# Patient Record
Sex: Male | Born: 1975 | Race: White | Hispanic: No | Marital: Married | Smoking: Never smoker
Health system: Southern US, Community
[De-identification: ages and names within clinical notes are randomized; demographics above are authoritative.]

## PROBLEM LIST (undated history)

## (undated) DIAGNOSIS — K8020 Calculus of gallbladder without cholecystitis without obstruction: Secondary | ICD-10-CM

## (undated) DIAGNOSIS — I10 Essential (primary) hypertension: Secondary | ICD-10-CM

## (undated) DIAGNOSIS — E119 Type 2 diabetes mellitus without complications: Secondary | ICD-10-CM

## (undated) HISTORY — DX: Essential (primary) hypertension: I10

---

## 2004-04-13 ENCOUNTER — Emergency Department: Payer: Self-pay | Admitting: Emergency Medicine

## 2004-06-02 ENCOUNTER — Emergency Department: Payer: Self-pay | Admitting: Unknown Physician Specialty

## 2009-07-06 ENCOUNTER — Emergency Department: Payer: Self-pay | Admitting: Emergency Medicine

## 2013-04-10 ENCOUNTER — Ambulatory Visit: Payer: Self-pay | Admitting: Physician Assistant

## 2013-04-10 LAB — RAPID STREP-A WITH REFLX: Micro Text Report: NEGATIVE

## 2013-04-13 LAB — BETA STREP CULTURE(ARMC)

## 2015-02-11 ENCOUNTER — Ambulatory Visit (INDEPENDENT_AMBULATORY_CARE_PROVIDER_SITE_OTHER): Payer: BLUE CROSS/BLUE SHIELD | Admitting: Family Medicine

## 2015-02-11 ENCOUNTER — Encounter: Payer: Self-pay | Admitting: Family Medicine

## 2015-02-11 VITALS — BP 144/91 | HR 104 | Temp 97.9°F | Resp 20 | Ht 71.0 in | Wt 224.9 lb

## 2015-02-11 DIAGNOSIS — I10 Essential (primary) hypertension: Secondary | ICD-10-CM

## 2015-02-11 MED ORDER — ATENOLOL 50 MG PO TABS: 50.0000 mg | ORAL_TABLET | Freq: Every day | ORAL | Status: DC

## 2015-02-11 NOTE — Progress Notes (Signed)
Name: Hunter StainJason L Zandi   MRN: [id number]    DOB: April 19, 1975   Date:02/11/2015       Progress Note  Subjective  Chief Complaint  Chief Complaint  Patient presents with  . Establish Care    NP (Dr. Carman ChingMoffet)  . Follow-up    BP    HPI  Elevated Blood Pressure  Pt. Is here for follow up of Elevated Blood Pressure. His daughter is a Theatre stage managernursing student and she started checking his BP about 4 weeks ago and it started coming up high. A log of BP on his cell phone shows BP averaging 145-150/90-100. Pt has never been on any pharmacotherapy for elevated blood pressure.  History reviewed. No pertinent past medical history.  History reviewed. No pertinent past surgical history.  Family History  Problem Relation Age of Onset  . Hypertension Father   . Heart disease Father     Triple bypass at 40 y.o    Social History   Social History  . Marital Status: Married    Spouse Name: N/A  . Number of Children: N/A  . Years of Education: N/A   Occupational History  . Not on file.   Social History Main Topics  . Smoking status: Never Smoker   . Smokeless tobacco: Never Used  . Alcohol Use: No  . Drug Use: No  . Sexual Activity:    Partners: Female   Other Topics Concern  . Not on file   Social History Narrative  . No narrative on file    No current outpatient prescriptions on file.  No Known Allergies  Review of Systems  Constitutional: Negative for fever and chills.  Eyes: Negative for blurred vision.  Respiratory: Negative for cough and shortness of breath.   Cardiovascular: Negative for chest pain (sometimes his chest feels tight almost as if its hard to breathe) and palpitations.  Neurological: Negative for headaches.   Objective  Filed Vitals:   02/11/15 1442  BP: 144/91  Pulse: 104  Temp: 97.9 F (36.6 C)  TempSrc: Oral  Resp: 20  Height: 5\' 11"  (1.803 m)  Weight: 224 lb 14.4 oz (102.014 kg)  SpO2: 97%    Physical Exam  Constitutional: He is oriented to  person, place, and time and well-developed, well-nourished, and in no distress.  HENT:  Head: Normocephalic and atraumatic.  Cardiovascular: Regular rhythm.  Tachycardia present.   No murmur heard. Pulmonary/Chest: Effort normal and breath sounds normal. He has no wheezes.  Neurological: He is alert and oriented to person, place, and time.  Skin: Skin is warm and dry.  Nursing note and vitals reviewed.   Assessment & Plan  1. Hypertension, poor control We'll start patient on atenolol 50 mg daily for elevated blood pressure. This should also lower his heart rate, which is above the normal range. An EKG obtained in the office today showed sinus tachycardia but no ST-T changes. Patient will follow-up with review of blood pressure logs in 2 weeks. - atenolol (TENORMIN) 50 MG tablet; Take 1 tablet (50 mg total) by mouth daily.  Dispense: 30 tablet; Refill: 0 - Comprehensive Metabolic Panel (CMET) - EKG 12-Lead   Taniela Feltus Asad A. Faylene KurtzShah [facility] 02/11/2015 3:16 PM

## 2015-02-25 ENCOUNTER — Encounter: Payer: Self-pay | Admitting: Family Medicine

## 2015-02-25 ENCOUNTER — Ambulatory Visit (INDEPENDENT_AMBULATORY_CARE_PROVIDER_SITE_OTHER): Payer: BLUE CROSS/BLUE SHIELD | Admitting: Family Medicine

## 2015-02-25 VITALS — BP 136/78 | HR 81 | Temp 98.4°F | Resp 18 | Ht 71.0 in | Wt 225.2 lb

## 2015-02-25 DIAGNOSIS — I10 Essential (primary) hypertension: Secondary | ICD-10-CM

## 2015-02-25 DIAGNOSIS — E669 Obesity, unspecified: Secondary | ICD-10-CM | POA: Insufficient documentation

## 2015-02-25 MED ORDER — ATENOLOL 50 MG PO TABS: 50.0000 mg | ORAL_TABLET | Freq: Every day | ORAL | Status: DC

## 2015-02-25 NOTE — Progress Notes (Signed)
Name: Hunter StainJason L Walrond   MRN: [id number]    DOB: 1975/10/14   Date:02/25/2015       Progress Note  Subjective  Chief Complaint  Chief Complaint  Patient presents with  . Follow-up    2 wk BP  . Hypertension    Hypertension This is a new problem. The current episode started more than 1 month ago. The problem is controlled. Pertinent negatives include no blurred vision, chest pain, headaches, palpitations or shortness of breath. Risk factors for coronary artery disease include male gender and obesity. Past treatments include beta blockers. There is no history of kidney disease, CAD/MI or CVA.    History reviewed. No pertinent past medical history.  History reviewed. No pertinent past surgical history.  Family History  Problem Relation Age of Onset  . Hypertension Father   . Heart disease Father     Triple bypass at 40 y.o    Social History   Social History  . Marital Status: Married    Spouse Name: N/A  . Number of Children: N/A  . Years of Education: N/A   Occupational History  . Not on file.   Social History Main Topics  . Smoking status: Never Smoker   . Smokeless tobacco: Never Used  . Alcohol Use: No  . Drug Use: No  . Sexual Activity:    Partners: Female   Other Topics Concern  . Not on file   Social History Narrative     Current outpatient prescriptions:  .  atenolol (TENORMIN) 50 MG tablet, Take 1 tablet (50 mg total) by mouth daily., Disp: 30 tablet, Rfl: 0  No Known Allergies   Review of Systems  Eyes: Negative for blurred vision.  Respiratory: Negative for shortness of breath.   Cardiovascular: Negative for chest pain and palpitations.  Neurological: Negative for headaches.    Objective  Filed Vitals:   02/25/15 0917  BP: 136/78  Pulse: 81  Temp: 98.4 F (36.9 C)  TempSrc: Oral  Resp: 18  Height: 5\' 11"  (1.803 m)  Weight: 225 lb 3.2 oz (102.15 kg)  SpO2: 97%    Physical Exam  Constitutional: He is oriented to person, place, and  time and well-developed, well-nourished, and in no distress.  HENT:  Head: Normocephalic and atraumatic.  Eyes: Pupils are equal, round, and reactive to light.  Cardiovascular: Normal rate, regular rhythm and normal heart sounds.   No murmur heard. Pulmonary/Chest: Effort normal and breath sounds normal. He has no wheezes.  Abdominal: Soft. Bowel sounds are normal. There is no tenderness.  Musculoskeletal: Normal range of motion. He exhibits no edema.  Neurological: He is alert and oriented to person, place, and time.  Skin: Skin is warm and dry.  Psychiatric: Mood, memory, affect and judgment normal.  Nursing note and vitals reviewed.   Assessment & Plan  1. Obesity Encouraged starting a regimen of physical activity to achieve sustained weight loss. - HgB A1c  2. Hypertension, well controlled BP well controlled on beta blocker therapy with no adverse effects. Continue. Refills provided - Lipid Profile - CBC with Differential - Comprehensive Metabolic Panel (CMET) - HgB A1c   Rustyn Conery Asad A. Faylene KurtzShah [facility] 02/25/2015 9:35 AM

## 2015-02-26 LAB — COMPREHENSIVE METABOLIC PANEL
ALK PHOS: 120 IU/L — AB (ref 39–117)
ALT: 71 IU/L — ABNORMAL HIGH (ref 0–44)
AST: 72 IU/L — AB (ref 0–40)
Albumin/Globulin Ratio: 1.5 (ref 1.1–2.5)
Albumin: 4.4 g/dL (ref 3.5–5.5)
BILIRUBIN TOTAL: 0.8 mg/dL (ref 0.0–1.2)
BUN/Creatinine Ratio: 13 (ref 9–20)
BUN: 12 mg/dL (ref 6–24)
CO2: 22 mmol/L (ref 18–29)
CREATININE: 0.91 mg/dL (ref 0.76–1.27)
Calcium: 9.8 mg/dL (ref 8.7–10.2)
Chloride: 98 mmol/L (ref 97–106)
GFR calc Af Amer: 121 mL/min/{1.73_m2} (ref >59)
GFR calc non Af Amer: 105 mL/min/{1.73_m2} (ref >59)
Globulin, Total: 3 g/dL (ref 1.5–4.5)
Glucose: 213 mg/dL — ABNORMAL HIGH (ref 65–99)
Potassium: 5 mmol/L (ref 3.5–5.2)
SODIUM: 139 mmol/L (ref 136–144)
Total Protein: 7.4 g/dL (ref 6.0–8.5)

## 2015-02-26 LAB — CBC WITH DIFFERENTIAL/PLATELET
Basophils Absolute: 0.1 10*3/uL (ref 0.0–0.2)
Basos: 1 %
EOS (ABSOLUTE): 0.2 10*3/uL (ref 0.0–0.4)
Eos: 2 %
HEMOGLOBIN: 16.1 g/dL (ref 12.6–17.7)
Hematocrit: 45.8 % (ref 37.5–51.0)
Immature Grans (Abs): 0 10*3/uL (ref 0.0–0.1)
Immature Granulocytes: 0 %
LYMPHS ABS: 2.8 10*3/uL (ref 0.7–3.1)
Lymphs: 38 %
MCH: 33.1 pg — AB (ref 26.6–33.0)
MCHC: 35.2 g/dL (ref 31.5–35.7)
MCV: 94 fL (ref 79–97)
Monocytes Absolute: 0.8 10*3/uL (ref 0.1–0.9)
Monocytes: 10 %
NEUTROS ABS: 3.5 10*3/uL (ref 1.4–7.0)
Neutrophils: 49 %
Platelets: 196 10*3/uL (ref 150–379)
RBC: 4.87 x10E6/uL (ref 4.14–5.80)
RDW: 12.5 % (ref 12.3–15.4)
WBC: 7.3 10*3/uL (ref 3.4–10.8)

## 2015-02-26 LAB — LIPID PANEL
CHOL/HDL RATIO: 6.2 ratio — AB (ref 0.0–5.0)
Cholesterol, Total: 185 mg/dL (ref 100–199)
HDL: 30 mg/dL — ABNORMAL LOW (ref >39)
LDL CALC: 135 mg/dL — AB (ref 0–99)
TRIGLYCERIDES: 99 mg/dL (ref 0–149)
VLDL CHOLESTEROL CAL: 20 mg/dL (ref 5–40)

## 2015-02-26 LAB — HEMOGLOBIN A1C
ESTIMATED AVERAGE GLUCOSE: 212 mg/dL
HEMOGLOBIN A1C: 9 % — AB (ref 4.8–5.6)

## 2015-02-26 LAB — COMMENT

## 2015-03-10 ENCOUNTER — Other Ambulatory Visit: Payer: Self-pay | Admitting: Family Medicine

## 2015-03-11 ENCOUNTER — Telehealth: Payer: Self-pay

## 2015-03-11 ENCOUNTER — Ambulatory Visit (INDEPENDENT_AMBULATORY_CARE_PROVIDER_SITE_OTHER): Payer: BLUE CROSS/BLUE SHIELD | Admitting: Family Medicine

## 2015-03-11 ENCOUNTER — Encounter: Payer: Self-pay | Admitting: Family Medicine

## 2015-03-11 VITALS — BP 138/78 | HR 71 | Temp 98.3°F | Resp 16 | Ht 71.0 in | Wt 218.9 lb

## 2015-03-11 DIAGNOSIS — IMO0001 Reserved for inherently not codable concepts without codable children: Secondary | ICD-10-CM

## 2015-03-11 DIAGNOSIS — R748 Abnormal levels of other serum enzymes: Secondary | ICD-10-CM

## 2015-03-11 DIAGNOSIS — E1165 Type 2 diabetes mellitus with hyperglycemia: Secondary | ICD-10-CM | POA: Diagnosis not present

## 2015-03-11 MED ORDER — LISINOPRIL 2.5 MG PO TABS: 2.5000 mg | ORAL_TABLET | Freq: Every day | ORAL | Status: DC

## 2015-03-11 MED ORDER — GLIPIZIDE 5 MG PO TABS: 5.0000 mg | ORAL_TABLET | Freq: Every day | ORAL | Status: DC

## 2015-03-11 NOTE — Progress Notes (Signed)
Name: Hunter Rodriguez   MRN: [id number]    DOB: 1976-01-21   Date:03/11/2015       Progress Note  Subjective  Chief Complaint  Chief Complaint  Patient presents with  . Follow-up    Pharmacotherapy  . Hypertension    Diabetes He presents for his initial diabetic visit. He has type 2 diabetes mellitus. His disease course has been stable. Pertinent negatives for diabetes include no fatigue, no polydipsia and no polyuria. He is following a generally healthy (Has cut down on sugar consumption dramatically since he learned about his diagnosis.) diet. His breakfast blood glucose range is generally 140-180 mg/dl. An ACE inhibitor/angiotensin II receptor blocker is not being taken. He does not see a podiatrist.Eye exam is not current.   Elevated Liver Enzymes Pt. Is here for work up of elevated liver enzymes, AST and ALT at 72 and 71 respectively. Pt. Has no known history of liver disease, does not drink alcohol, and has no associated  concerning symptoms.   Past Medical History  Diagnosis Date  . Hypertension     History reviewed. No pertinent past surgical history.  Family History  Problem Relation Age of Onset  . Hypertension Father   . Heart disease Father     Triple bypass at 65 y.o    Social History   Social History  . Marital Status: Married    Spouse Name: N/A  . Number of Children: N/A  . Years of Education: N/A   Occupational History  . Not on file.   Social History Main Topics  . Smoking status: Never Smoker   . Smokeless tobacco: Never Used  . Alcohol Use: No  . Drug Use: No  . Sexual Activity:    Partners: Female   Other Topics Concern  . Not on file   Social History Narrative     Current outpatient prescriptions:  .  atenolol (TENORMIN) 50 MG tablet, Take 1 tablet (50 mg total) by mouth daily., Disp: 90 tablet, Rfl: 0 .  atenolol (TENORMIN) 50 MG tablet, TAKE 1 TABLET(50 MG) BY MOUTH DAILY, Disp: 30 tablet, Rfl: 0  No Known Allergies   Review of  Systems  Constitutional: Negative for malaise/fatigue and fatigue.  Gastrointestinal: Negative for nausea, vomiting, abdominal pain, diarrhea and blood in stool.  Genitourinary: Negative for hematuria.  Endo/Heme/Allergies: Negative for polydipsia.    Objective  Filed Vitals:   03/11/15 1409  BP: 138/78  Pulse: 71  Temp: 98.3 F (36.8 C)  TempSrc: Oral  Resp: 16  Height:  (1.803 m)  Weight: 218 lb 14.4 oz (99.292 kg)  SpO2: 98%    Physical Exam  Constitutional: He is oriented to person, place, and time and well-developed, well-nourished, and in no distress.  HENT:  Head: Normocephalic and atraumatic.  Eyes: Pupils are equal, round, and reactive to light.  Cardiovascular: Normal rate, regular rhythm and normal heart sounds.   Pulmonary/Chest: Effort normal and breath sounds normal. He has no wheezes.  Abdominal: Soft. Bowel sounds are normal. There is no tenderness.  Neurological: He is alert and oriented to person, place, and time.  Skin: Skin is warm and dry.  Nursing note and vitals reviewed.    Recent Results (from the past 2160 hour(s))  Lipid Profile     Status: Abnormal   Collection Time: 02/25/15 10:09 AM  Result Value Ref Range   Cholesterol, Total 185 100 - 199 mg/dL   Triglycerides 99 0 - 149 mg/dL   HDL 30 (  L) >39 mg/dL   VLDL Cholesterol Cal 20 5 - 40 mg/dL   LDL Calculated 119135 (H) 0 - 99 mg/dL   Chol/HDL Ratio 6.2 (H) 0.0 - 5.0 ratio units    Comment:                                   T. Chol/HDL Ratio                                             Men  Women                               1/2 Avg.Risk  3.4    3.3                                   Avg.Risk  5.0    4.4                                2X Avg.Risk  9.6    7.1                                3X Avg.Risk 23.4   11.0   CBC with Differential     Status: Abnormal   Collection Time: 02/25/15 10:09 AM  Result Value Ref Range   WBC 7.3 3.4 - 10.8 x10E3/uL   RBC 4.87 4.14 - 5.80 x10E6/uL    Hemoglobin 16.1 12.6 - 17.7 g/dL   Hematocrit 14.745.8 82.937.5 - 51.0 %   MCV 94 79 - 97 fL   MCH 33.1 (H) 26.6 - 33.0 pg   MCHC 35.2 31.5 - 35.7 g/dL   RDW 56.212.5 13.012.3 - 86.515.4 %   Platelets 196 150 - 379 x10E3/uL   Neutrophils 49 %   Lymphs 38 %   Monocytes 10 %   Eos 2 %   Basos 1 %   Neutrophils Absolute 3.5 1.4 - 7.0 x10E3/uL   Lymphocytes Absolute 2.8 0.7 - 3.1 x10E3/uL   Monocytes Absolute 0.8 0.1 - 0.9 x10E3/uL   EOS (ABSOLUTE) 0.2 0.0 - 0.4 x10E3/uL   Basophils Absolute 0.1 0.0 - 0.2 x10E3/uL   Immature Granulocytes 0 %   Immature Grans (Abs) 0.0 0.0 - 0.1 x10E3/uL  Comprehensive Metabolic Panel (CMET)     Status: Abnormal   Collection Time: 02/25/15 10:09 AM  Result Value Ref Range   Glucose 213 (H) 65 - 99 mg/dL    Comment: Specimen received in contact with cells. No visible hemolysis present. However GLUC may be decreased and K increased. Clinical correlation indicated.    BUN 12 6 - 24 mg/dL   Creatinine, Ser 7.840.91 0.76 - 1.27 mg/dL   GFR calc non Af Amer 105 >59 mL/min/1.73   GFR calc Af Amer 121 >59 mL/min/1.73   BUN/Creatinine Ratio 13 9 - 20   Sodium 139 136 - 144 mmol/L   Potassium 5.0 3.5 - 5.2 mmol/L   Chloride 98 97 - 106 mmol/L   CO2 22 18 - 29 mmol/L   Calcium 9.8 8.7 -  10.2 mg/dL   Total Protein 7.4 6.0 - 8.5 g/dL   Albumin 4.4 3.5 - 5.5 g/dL   Globulin, Total 3.0 1.5 - 4.5 g/dL   Albumin/Globulin Ratio 1.5 1.1 - 2.5   Bilirubin Total 0.8 0.0 - 1.2 mg/dL   Alkaline Phosphatase 120 (H) 39 - 117 IU/L   AST 72 (H) 0 - 40 IU/L   ALT 71 (H) 0 - 44 IU/L  HgB A1c     Status: Abnormal   Collection Time: 02/25/15 10:09 AM  Result Value Ref Range   Hgb A1c MFr Bld 9.0 (H) 4.8 - 5.6 %    Comment:          Pre-diabetes: 5.7 - 6.4          Diabetes: >6.4          Glycemic control for adults with diabetes: <7.0    Est. average glucose Bld gHb Est-mCnc 212 mg/dL  Comment     Status: None   Collection Time: 02/25/15 10:09 AM  Result Value Ref Range   Comment  Comment     Comment: According to ATP-III Guidelines, HDL-C >59 mg/dL is considered a negative risk factor for CHD.      Assessment & Plan  1. Uncontrolled type 2 diabetes mellitus without complication, without long-term current use of insulin (HCC) We'll start on glipizide 5 mg daily. Results of A1c reviewed in detail along with possible complications from poorly controlled blood sugar. Started on lisinopril 2.5 mg daily for renal protection - glipiZIDE (GLUCOTROL) 5 MG tablet; Take 1 tablet (5 mg total) by mouth daily before breakfast.  Dispense: 90 tablet; Refill: 0 - lisinopril (PRINIVIL,ZESTRIL) 2.5 MG tablet; Take 1 tablet (2.5 mg total) by mouth daily.  Dispense: 90 tablet; Refill: 0  2. Elevated liver enzymes Will obtain laboratory and imaging workup for elevated liver enzymes. Likely etiology is fatty liver. Follow-up after workup is complete. - CBC with Differential - Comprehensive Metabolic Panel (CMET) - Hepatic function panel - Hepatitis panel, acute - US Abdomen Limited RUQ; Future - Gamma GT   Agness Sibrian Asad [facility] 03/11/2015 2:18 PM

## 2015-03-11 NOTE — Telephone Encounter (Signed)
Rx for glucose meter, test strips, and lancets is ready for pickup

## 2015-03-11 NOTE — Telephone Encounter (Signed)
Routed to Dr Sherryll BurgerShah to write prescription for glucose meter and strips

## 2015-03-12 LAB — CBC WITH DIFFERENTIAL/PLATELET
BASOS ABS: 0.1 10*3/uL (ref 0.0–0.2)
Basos: 1 %
EOS (ABSOLUTE): 0.1 10*3/uL (ref 0.0–0.4)
Eos: 2 %
Hematocrit: 46 % (ref 37.5–51.0)
Hemoglobin: 16.3 g/dL (ref 12.6–17.7)
IMMATURE GRANS (ABS): 0 10*3/uL (ref 0.0–0.1)
Immature Granulocytes: 0 %
LYMPHS: 40 %
Lymphocytes Absolute: 2.4 10*3/uL (ref 0.7–3.1)
MCH: 33.5 pg — AB (ref 26.6–33.0)
MCHC: 35.4 g/dL (ref 31.5–35.7)
MCV: 95 fL (ref 79–97)
MONOS ABS: 0.7 10*3/uL (ref 0.1–0.9)
Monocytes: 12 %
NEUTROS ABS: 2.7 10*3/uL (ref 1.4–7.0)
Neutrophils: 45 %
Platelets: 232 10*3/uL (ref 150–379)
RBC: 4.87 x10E6/uL (ref 4.14–5.80)
RDW: 12.6 % (ref 12.3–15.4)
WBC: 5.9 10*3/uL (ref 3.4–10.8)

## 2015-03-12 LAB — COMPREHENSIVE METABOLIC PANEL
A/G RATIO: 1.5 (ref 1.1–2.5)
ALBUMIN: 4.9 g/dL (ref 3.5–5.5)
ALT: 72 IU/L — AB (ref 0–44)
AST: 62 IU/L — ABNORMAL HIGH (ref 0–40)
Alkaline Phosphatase: 103 IU/L (ref 39–117)
BILIRUBIN TOTAL: 0.9 mg/dL (ref 0.0–1.2)
BUN / CREAT RATIO: 15 (ref 9–20)
BUN: 17 mg/dL (ref 6–24)
CALCIUM: 10 mg/dL (ref 8.7–10.2)
CHLORIDE: 98 mmol/L (ref 97–106)
CO2: 25 mmol/L (ref 18–29)
Creatinine, Ser: 1.17 mg/dL (ref 0.76–1.27)
GFR, EST AFRICAN AMERICAN: 90 mL/min/{1.73_m2} (ref >59)
GFR, EST NON AFRICAN AMERICAN: 78 mL/min/{1.73_m2} (ref >59)
GLOBULIN, TOTAL: 3.3 g/dL (ref 1.5–4.5)
Glucose: 163 mg/dL — ABNORMAL HIGH (ref 65–99)
POTASSIUM: 4.3 mmol/L (ref 3.5–5.2)
SODIUM: 141 mmol/L (ref 136–144)
TOTAL PROTEIN: 8.2 g/dL (ref 6.0–8.5)

## 2015-03-12 LAB — HEPATITIS PANEL, ACUTE
HEP B S AG: NEGATIVE
Hep A IgM: NEGATIVE
Hep B C IgM: NEGATIVE
Hep C Virus Ab: <0.1 s/co ratio (ref 0.0–0.9)

## 2015-03-12 LAB — GAMMA GT: GGT: 64 IU/L (ref 0–65)

## 2015-03-12 LAB — HEPATIC FUNCTION PANEL: Bilirubin, Direct: 0.22 mg/dL (ref 0.00–0.40)

## 2015-03-18 ENCOUNTER — Telehealth: Payer: Self-pay | Admitting: Family Medicine

## 2015-03-18 NOTE — Telephone Encounter (Signed)
Pt said that he is not going to his appt for it is out of NETWORK. He needs a place that is in NETWORK with his insurance. This is for an ultrasound.

## 2015-03-19 ENCOUNTER — Ambulatory Visit: Payer: BLUE CROSS/BLUE SHIELD

## 2015-03-24 ENCOUNTER — Telehealth: Payer: Self-pay

## 2015-03-24 NOTE — Telephone Encounter (Signed)
Routed to Dr. Shah for medication change 

## 2015-03-24 NOTE — Telephone Encounter (Signed)
Returned patient's phone call and discussed that lisinopril 2,500,000 g daily is unlikely to raise his liver enzymes. Workup is in progress for liver enzymes including an ultrasound which he is scheduled for tomorrow. He is feeling fatigued and his a.m. blood sugars are in 80s. Advised to continue check his sugars and we'll check back after results of ultrasound are available.

## 2015-03-25 ENCOUNTER — Ambulatory Visit
Admission: RE | Admit: 2015-03-25 | Discharge: 2015-03-25 | Disposition: A | Discharge status: Home / Self Care | Payer: BLUE CROSS/BLUE SHIELD | Source: Ambulatory Visit | Attending: Family Medicine | Admitting: Family Medicine

## 2015-03-25 DIAGNOSIS — R748 Abnormal levels of other serum enzymes: Secondary | ICD-10-CM

## 2015-04-06 ENCOUNTER — Encounter: Payer: Self-pay | Admitting: Emergency Medicine

## 2015-04-06 ENCOUNTER — Emergency Department: Payer: BLUE CROSS/BLUE SHIELD

## 2015-04-06 ENCOUNTER — Emergency Department
Admission: EM | Admit: 2015-04-06 | Discharge: 2015-04-06 | Disposition: A | Discharge status: Home / Self Care | Payer: BLUE CROSS/BLUE SHIELD | Attending: Emergency Medicine | Admitting: Emergency Medicine

## 2015-04-06 DIAGNOSIS — R1013 Epigastric pain: Secondary | ICD-10-CM | POA: Insufficient documentation

## 2015-04-06 DIAGNOSIS — Z79899 Other long term (current) drug therapy: Secondary | ICD-10-CM | POA: Insufficient documentation

## 2015-04-06 DIAGNOSIS — E119 Type 2 diabetes mellitus without complications: Secondary | ICD-10-CM | POA: Diagnosis not present

## 2015-04-06 DIAGNOSIS — I10 Essential (primary) hypertension: Secondary | ICD-10-CM | POA: Insufficient documentation

## 2015-04-06 DIAGNOSIS — F419 Anxiety disorder, unspecified: Secondary | ICD-10-CM | POA: Diagnosis not present

## 2015-04-06 DIAGNOSIS — R079 Chest pain, unspecified: Secondary | ICD-10-CM | POA: Diagnosis not present

## 2015-04-06 HISTORY — DX: Calculus of gallbladder without cholecystitis without obstruction: K80.20

## 2015-04-06 HISTORY — DX: Type 2 diabetes mellitus without complications: E11.9

## 2015-04-06 LAB — COMPREHENSIVE METABOLIC PANEL
ALBUMIN: 4.6 g/dL (ref 3.5–5.0)
ALK PHOS: 76 U/L (ref 38–126)
ALT: 46 U/L (ref 17–63)
AST: 32 U/L (ref 15–41)
Anion gap: 7 (ref 5–15)
BILIRUBIN TOTAL: 0.4 mg/dL (ref 0.3–1.2)
BUN: 19 mg/dL (ref 6–20)
CALCIUM: 9.4 mg/dL (ref 8.9–10.3)
CO2: 27 mmol/L (ref 22–32)
Chloride: 106 mmol/L (ref 101–111)
Creatinine, Ser: 0.92 mg/dL (ref 0.61–1.24)
GFR calc Af Amer: >60 mL/min (ref >60)
GLUCOSE: 118 mg/dL — AB (ref 65–99)
Potassium: 4.1 mmol/L (ref 3.5–5.1)
Sodium: 140 mmol/L (ref 135–145)
TOTAL PROTEIN: 8.2 g/dL — AB (ref 6.5–8.1)

## 2015-04-06 LAB — CBC
HEMATOCRIT: 46.5 % (ref 40.0–52.0)
Hemoglobin: 16.4 g/dL (ref 13.0–18.0)
MCH: 33.2 pg (ref 26.0–34.0)
MCHC: 35.2 g/dL (ref 32.0–36.0)
MCV: 94.3 fL (ref 80.0–100.0)
Platelets: 186 10*3/uL (ref 150–440)
RBC: 4.93 MIL/uL (ref 4.40–5.90)
RDW: 12.3 % (ref 11.5–14.5)
WBC: 7.3 10*3/uL (ref 3.8–10.6)

## 2015-04-06 LAB — LIPASE, BLOOD: Lipase: 39 U/L (ref 11–51)

## 2015-04-06 LAB — TROPONIN I: Troponin I: <0.03 ng/mL (ref <0.031)

## 2015-04-06 MED ORDER — GI COCKTAIL ~~LOC~~
30.0000 mL | Freq: Once | ORAL | Status: AC
  Administered 2015-04-06: 30 mL via ORAL
  Filled 2015-04-06: qty 30

## 2015-04-06 MED ORDER — OMEPRAZOLE 20 MG PO CPDR: 20.0000 mg | DELAYED_RELEASE_CAPSULE | Freq: Every day | ORAL | Status: DC

## 2015-04-06 NOTE — ED Notes (Signed)
Pt says he began having chest pain to the center of his chest since yesterday; dull; worse with laying down; tried OTC indigestion medication with no relief; pt's father and grandfather had cardiac issues and pt says he's nervous;

## 2015-04-06 NOTE — Discharge Instructions (Signed)
Nonspecific Chest Pain  °Chest pain can be caused by many different conditions. There is always a chance that your pain could be related to something serious, such as a heart attack or a blood clot in your lungs. Chest pain can also be caused by conditions that are not life-threatening. If you have chest pain, it is very important to follow up with your health care provider. °CAUSES  °Chest pain can be caused by: °· Heartburn. °· Pneumonia or bronchitis. °· Anxiety or stress. °· Inflammation around your heart (pericarditis) or lung (pleuritis or pleurisy). °· A blood clot in your lung. °· A collapsed lung (pneumothorax). It can develop suddenly on its own (spontaneous pneumothorax) or from trauma to the chest. °· Shingles infection (varicella-zoster virus). °· Heart attack. °· Damage to the bones, muscles, and cartilage that make up your chest wall. This can include: °¨ Bruised bones due to injury. °¨ Strained muscles or cartilage due to frequent or repeated coughing or overwork. °¨ Fracture to one or more ribs. °¨ Sore cartilage due to inflammation (costochondritis). °RISK FACTORS  °Risk factors for chest pain may include: °· Activities that increase your risk for trauma or injury to your chest. °· Respiratory infections or conditions that cause frequent coughing. °· Medical conditions or overeating that can cause heartburn. °· Heart disease or family history of heart disease. °· Conditions or health behaviors that increase your risk of developing a blood clot. °· Having had chicken pox (varicella zoster). °SIGNS AND SYMPTOMS °Chest pain can feel like: °· Burning or tingling on the surface of your chest or deep in your chest. °· Crushing, pressure, aching, or squeezing pain. °· Dull or sharp pain that is worse when you move, cough, or take a deep breath. °· Pain that is also felt in your back, neck, shoulder, or arm, or pain that spreads to any of these areas. °Your chest pain may come and go, or it may stay  constant. °DIAGNOSIS °Lab tests or other studies may be needed to find the cause of your pain. Your health care provider may have you take a test called an ambulatory ECG (electrocardiogram). An ECG records your heartbeat patterns at the time the test is performed. You may also have other tests, such as: °· Transthoracic echocardiogram (TTE). During echocardiography, sound waves are used to create a picture of all of the heart structures and to look at how blood flows through your heart. °· Transesophageal echocardiogram (TEE). This is a more advanced imaging test that obtains images from inside your body. It allows your health care provider to see your heart in finer detail. °· Cardiac monitoring. This allows your health care provider to monitor your heart rate and rhythm in real time. °· Holter monitor. This is a portable device that records your heartbeat and can help to diagnose abnormal heartbeats. It allows your health care provider to track your heart activity for several days, if needed. °· Stress tests. These can be done through exercise or by taking medicine that makes your heart beat more quickly. °· Blood tests. °· Imaging tests. °TREATMENT  °Your treatment depends on what is causing your chest pain. Treatment may include: °· Medicines. These may include: °¨ Acid blockers for heartburn. °¨ Anti-inflammatory medicine. °¨ Pain medicine for inflammatory conditions. °¨ Antibiotic medicine, if an infection is present. °¨ Medicines to dissolve blood clots. °¨ Medicines to treat coronary artery disease. °· Supportive care for conditions that do not require medicines. This may include: °¨ Resting. °¨ Applying heat   or cold packs to injured areas. °¨ Limiting activities until pain decreases. °HOME CARE INSTRUCTIONS °· If you were prescribed an antibiotic medicine, finish it all even if you start to feel better. °· Avoid any activities that bring on chest pain. °· Do not use any tobacco products, including  cigarettes, chewing tobacco, or electronic cigarettes. If you need help quitting, ask your health care provider. °· Do not drink alcohol. °· Take medicines only as directed by your health care provider. °· Keep all follow-up visits as directed by your health care provider. This is important. This includes any further testing if your chest pain does not go away. °· If heartburn is the cause for your chest pain, you may be told to keep your head raised (elevated) while sleeping. This reduces the chance that acid will go from your stomach into your esophagus. °· Make lifestyle changes as directed by your health care provider. These may include: °¨ Getting regular exercise. Ask your health care provider to suggest some activities that are safe for you. °¨ Eating a heart-healthy diet. A registered dietitian can help you to learn healthy eating options. °¨ Maintaining a healthy weight. °¨ Managing diabetes, if necessary. °¨ Reducing stress. °SEEK MEDICAL CARE IF: °· Your chest pain does not go away after treatment. °· You have a rash with blisters on your chest. °· You have a fever. °SEEK IMMEDIATE MEDICAL CARE IF:  °· Your chest pain is worse. °· You have an increasing cough, or you cough up blood. °· You have severe abdominal pain. °· You have severe weakness. °· You faint. °· You have chills. °· You have sudden, unexplained chest discomfort. °· You have sudden, unexplained discomfort in your arms, back, neck, or jaw. °· You have shortness of breath at any time. °· You suddenly start to sweat, or your skin gets clammy. °· You feel nauseous or you vomit. °· You suddenly feel light-headed or dizzy. °· Your heart begins to beat quickly, or it feels like it is skipping beats. °These symptoms may represent a serious problem that is an emergency. Do not wait to see if the symptoms will go away. Get medical help right away. Call your local emergency services (911 in the [place] Do not drive yourself to the hospital. °  °This  information is not intended to replace advice given to you by your health care provider. Make sure you discuss any questions you have with your health care provider. °  °Document Released: 01/05/2005 Document Revised: 04/18/2014 Document Reviewed: 11/01/2013 °[employer] Interactive Patient Education ©2016 [employer] ° °

## 2015-04-06 NOTE — ED Provider Notes (Signed)
[facility] Emergency Department Provider Note  ____________________________________________  Time seen: 7 am  I have reviewed the triage vital signs and the nursing notes.   HISTORY  Chief Complaint Chest Pain    HPI Hunter StainJason L Rodriguez is a 39 y.o. male who presents with complaints of chest and epigastric discomfort. He reports started grow than 24 hours ago and has been constant. It started yesterday morning when he woke up he felt indigestion and took some Tums with significant relief but continued to have mild "aching" in his lower chest/epigastrium. He does have a history of diabetes and high blood pressure. He has a history of significant heart disease in his family less than 40 years of age so he was concerned and came to be evaluated. He denies shortness of breath. No recent travels. No fevers chills. He has not been taking his omeprazole regularly only symptomatically     Past Medical History  Diagnosis Date  . Hypertension   . Diabetes mellitus without complication (HCC)   . Gallstones     Patient Active Problem List   Diagnosis Date Noted  . Diabetes mellitus type 2, uncontrolled, without complications (HCC) 03/11/2015  . Elevated liver enzymes 03/11/2015  . Obesity 02/25/2015  . Hypertension, well controlled 02/11/2015    History reviewed. No pertinent past surgical history.  Current Outpatient Rx  Name  Route  Sig  Dispense  Refill  . atenolol (TENORMIN) 50 MG tablet   Oral   Take 1 tablet (50 mg total) by mouth daily.   90 tablet   0   . atenolol (TENORMIN) 50 MG tablet      TAKE 1 TABLET(50 MG) BY MOUTH DAILY   30 tablet   0   . glipiZIDE (GLUCOTROL) 5 MG tablet   Oral   Take 1 tablet (5 mg total) by mouth daily before breakfast.   90 tablet   0   . lisinopril (PRINIVIL,ZESTRIL) 2.5 MG tablet   Oral   Take 1 tablet (2.5 mg total) by mouth daily.   90 tablet   0     Allergies Review of patient's allergies indicates no  known allergies.  Family History  Problem Relation Age of Onset  . Hypertension Father   . Heart disease Father     Triple bypass at 40 y.o    Social History Social History  Substance Use Topics  . Smoking status: Never Smoker   . Smokeless tobacco: Never Used  . Alcohol Use: No    Review of Systems  Constitutional: Negative for fever. Eyes: Negative for visual changes. ENT: Negative for sore throat Cardiovascular: As above Respiratory: Negative for shortness of breath. Gastrointestinal: Negative for abdominal pain, vomiting and diarrhea. Genitourinary: Negative for dysuria. Musculoskeletal: Negative for back pain. Skin: Negative for rash. Neurological: Negative for headaches  Psychiatric: Mild anxiety    ____________________________________________   PHYSICAL EXAM:  VITAL SIGNS: ED Triage Vitals  Enc Vitals Group     BP 04/06/15 0625 158/103 mmHg     Pulse Rate 04/06/15 0625 72     Resp 04/06/15 0625 18     Temp 04/06/15 0625 98.3 F (36.8 C)     Temp Source 04/06/15 0625 Oral     SpO2 04/06/15 0625 100 %     Weight 04/06/15 0625 215 lb (97.523 kg)     Height 04/06/15 0625 5\' 11"  (1.803 m)     Head Cir --      Peak Flow --  Pain Score 04/06/15 0626 3     Pain Loc --      Pain Edu? --      Excl. in GC? --      Constitutional: Alert and oriented. Well appearing and in no distress. Eyes: Conjunctivae are normal.  ENT   Head: Normocephalic and atraumatic.   Mouth/Throat: Mucous membranes are moist. Cardiovascular: Normal rate, regular rhythm. Normal and symmetric distal pulses are present in all extremities. No murmurs, rubs, or gallops. Respiratory: Normal respiratory effort without tachypnea nor retractions. Breath sounds are clear and equal bilaterally.  Gastrointestinal: Soft and non-tender in all quadrants. No distention. There is no CVA tenderness. Genitourinary: deferred Musculoskeletal: Nontender with normal range of motion in all  extremities. No lower extremity tenderness nor edema. Neurologic:  Normal speech and language. No gross focal neurologic deficits are appreciated. Skin:  Skin is warm, dry and intact. No rash noted. Psychiatric: Mood and affect are normal. Patient exhibits appropriate insight and judgment.  ____________________________________________    LABS (pertinent positives/negatives)  Labs Reviewed  CBC  TROPONIN I  COMPREHENSIVE METABOLIC PANEL  LIPASE, BLOOD    ____________________________________________   EKG  ED ECG REPORT I, Jene Every, the attending physician, personally viewed and interpreted this ECG.  Date: 04/06/2015 EKG Time: 6:33 AM Rate: 69 Rhythm: normal sinus rhythm QRS Axis: normal Intervals: normal ST/T Wave abnormalities: normal Conduction Disutrbances: none Narrative Interpretation: unremarkable   ____________________________________________    RADIOLOGY I have personally reviewed any xrays that were ordered on this patient: Chest x-ray normal  ____________________________________________   PROCEDURES  Procedure(s) performed: none  Critical Care performed: none  ____________________________________________   INITIAL IMPRESSION / ASSESSMENT AND PLAN / ED COURSE  Pertinent labs & imaging results that were available during my care of the patient were reviewed by me and considered in my medical decision making (see chart for details).  Patient well-appearing and in no distress. EKG is entirely normal. History of present illness does not suggest ACS rather I'm suspicious of gastritis. We will obtain labs, chest x-ray, trial GI cocktail and reevaluate  Patient had complete resolution of discomfort with GI cocktail. Recommended to take his PPI regularly and to follow up with his PCP. Return precautions discussed  ____________________________________________   FINAL CLINICAL IMPRESSION(S) / ED DIAGNOSES  Final diagnoses:  Chest pain,  unspecified chest pain type     Jene Every, MD 04/07/15 1520

## 2015-04-06 NOTE — ED Notes (Signed)
IV insertion attempted x2 without success.

## 2015-04-07 ENCOUNTER — Telehealth: Payer: Self-pay | Admitting: Family Medicine

## 2015-04-07 NOTE — Telephone Encounter (Signed)
Pt states he was in the [facility] ER and would like a call back please.

## 2015-04-08 NOTE — Telephone Encounter (Signed)
Routed to Dr. Shah for advice  

## 2015-04-09 ENCOUNTER — Telehealth: Payer: Self-pay | Admitting: Family Medicine

## 2015-04-09 NOTE — Telephone Encounter (Signed)
Patient reports blood pressure is elevated when he first wakes up but then comes down later in the day. He takes his blood pressure medicine in the morning. Advised to check his blood pressure and return in 2 weeks for review. Reviewed ER visit and the fact that blood pressure was elevated in the ER.

## 2015-04-09 NOTE — Telephone Encounter (Signed)
Patient is requesting a refill on his test strips. Please send to [facility]. States that it was written for once day but was told to take it twice daily.

## 2015-04-10 NOTE — Telephone Encounter (Signed)
Test strips has been called in to [facility] and are ready for pickup

## 2015-04-22 ENCOUNTER — Encounter: Payer: Self-pay | Admitting: Family Medicine

## 2015-04-22 ENCOUNTER — Ambulatory Visit (INDEPENDENT_AMBULATORY_CARE_PROVIDER_SITE_OTHER): Payer: BLUE CROSS/BLUE SHIELD | Admitting: Family Medicine

## 2015-04-22 VITALS — BP 132/77 | HR 70 | Temp 97.9°F | Resp 17 | Ht 71.0 in | Wt 215.8 lb

## 2015-04-22 DIAGNOSIS — R778 Other specified abnormalities of plasma proteins: Secondary | ICD-10-CM | POA: Diagnosis not present

## 2015-04-22 DIAGNOSIS — R5383 Other fatigue: Secondary | ICD-10-CM | POA: Diagnosis not present

## 2015-04-22 DIAGNOSIS — E1165 Type 2 diabetes mellitus with hyperglycemia: Secondary | ICD-10-CM

## 2015-04-22 DIAGNOSIS — I10 Essential (primary) hypertension: Secondary | ICD-10-CM | POA: Diagnosis not present

## 2015-04-22 DIAGNOSIS — IMO0001 Reserved for inherently not codable concepts without codable children: Secondary | ICD-10-CM

## 2015-04-22 MED ORDER — ACCU-CHEK AVIVA PLUS VI STRP: 1.0000 | ORAL_STRIP | Freq: Two times a day (BID) | Status: AC

## 2015-04-22 MED ORDER — LISINOPRIL 10 MG PO TABS: 10.0000 mg | ORAL_TABLET | Freq: Every day | ORAL | Status: DC

## 2015-04-22 NOTE — Progress Notes (Signed)
Name: Hunter Rodriguez   MRN: [id number]    DOB: 11/14/1975   Date:04/22/2015       Progress Note  Subjective  Chief Complaint  Chief Complaint  Patient presents with  . Follow-up    check BP  . Diabetes  . Hypertension    Diabetes He presents for his follow-up diabetic visit. He has type 2 diabetes mellitus. His disease course has been stable. There are no hypoglycemic associated symptoms. Pertinent negatives for hypoglycemia include no headaches. Pertinent negatives for diabetes include no blurred vision and no chest pain. Symptoms are stable. Pertinent negatives for diabetic complications include no autonomic neuropathy, CVA or heart disease. Current diabetic treatment includes oral agent (monotherapy). His weight is decreasing steadily. He is following a diabetic diet. He monitors blood glucose at home 1-2 x per day. There is no change in his home blood glucose trend. His breakfast blood glucose range is generally 70-90 mg/dl. An ACE inhibitor/angiotensin II receptor blocker is being taken.  Hypertension This is a recurrent problem. The problem is unchanged. The problem is controlled. Associated symptoms include malaise/fatigue (stays tired, low energy). Pertinent negatives include no blurred vision, chest pain, headaches, palpitations or shortness of breath. Past treatments include beta blockers and ACE inhibitors. The current treatment provides significant improvement. There is no history of kidney disease, CAD/MI or CVA.    Past Medical History  Diagnosis Date  . Hypertension   . Diabetes mellitus without complication ([place])   . Gallstones     History reviewed. No pertinent past surgical history.  Family History  Problem Relation Age of Onset  . Hypertension Father   . Heart disease Father     Triple bypass at 5 y.o    Social History   Social History  . Marital Status: Married    Spouse Name: N/A  . Number of Children: N/A  . Years of Education: N/A   Occupational  History  . Not on file.   Social History Main Topics  . Smoking status: Never Smoker   . Smokeless tobacco: Never Used  . Alcohol Use: No  . Drug Use: No  . Sexual Activity:    Partners: Female   Other Topics Concern  . Not on file   Social History Narrative     Current outpatient prescriptions:  .  ACCU-CHEK AVIVA PLUS test strip, TEST BLOOD SUGAR ONCE A DAY, Disp: , Rfl: 0 .  ACCU-CHEK SOFTCLIX LANCETS lancets, TEST BLOOD SUGAR ONCE DAILY, Disp: , Rfl: 0 .  atenolol (TENORMIN) 50 MG tablet, Take 1 tablet (50 mg total) by mouth daily., Disp: 90 tablet, Rfl: 0 .  atenolol (TENORMIN) 50 MG tablet, TAKE 1 TABLET(50 MG) BY MOUTH DAILY, Disp: 30 tablet, Rfl: 0 .  Blood Glucose Monitoring Suppl (ACCU-CHEK AVIVA PLUS) w/Device KIT, FPD, Disp: , Rfl: 0 .  glipiZIDE (GLUCOTROL) 5 MG tablet, Take 1 tablet (5 mg total) by mouth daily before breakfast., Disp: 90 tablet, Rfl: 0 .  lisinopril (PRINIVIL,ZESTRIL) 2.5 MG tablet, Take 1 tablet (2.5 mg total) by mouth daily., Disp: 90 tablet, Rfl: 0 .  omeprazole (PRILOSEC) 20 MG capsule, Take 1 capsule (20 mg total) by mouth daily., Disp: 30 capsule, Rfl: 1  No Known Allergies   Review of Systems  Constitutional: Positive for malaise/fatigue (stays tired, low energy).  Eyes: Negative for blurred vision.  Respiratory: Negative for shortness of breath.   Cardiovascular: Negative for chest pain and palpitations.  Neurological: Negative for headaches.   Objective  Filed  Vitals:   04/22/15 1535  BP: 132/77  Pulse: 70  Temp: 97.9 F (36.6 C)  TempSrc: Oral  Resp: 17  Height: _0  (1.803 m)  Weight: 215 lb 12.8 oz (97.886 kg)  SpO2: 98%    Physical Exam  Constitutional: He is oriented to person, place, and time and well-developed, well-nourished, and in no distress.  HENT:  Head: Normocephalic and atraumatic.  Cardiovascular: Normal rate, regular rhythm and normal heart sounds.   Pulmonary/Chest: Effort normal and breath sounds  normal.  Abdominal: Soft. Bowel sounds are normal. There is no tenderness.  Musculoskeletal: He exhibits no edema.  Neurological: He is alert and oriented to person, place, and time.  Nursing note and vitals reviewed.   Recent Results (from the past 2160 hour(s))  Lipid Profile     Status: Abnormal   Collection Time: 02/25/15 10:09 AM  Result Value Ref Range   Cholesterol, Total 185 100 - 199 mg/dL   Triglycerides 99 0 - 149 mg/dL   HDL 30 (L) >39 mg/dL   VLDL Cholesterol Cal 20 5 - 40 mg/dL   LDL Calculated 135 (H) 0 - 99 mg/dL   Chol/HDL Ratio 6.2 (H) 0.0 - 5.0 ratio units    Comment:                                   T. Chol/HDL Ratio                                             Men  Women                               1/2 Avg.Risk  3.4    3.3                                   Avg.Risk  5.0    4.4                                2X Avg.Risk  9.6    7.1                                3X Avg.Risk 23.4   11.0   CBC with Differential     Status: Abnormal   Collection Time: 02/25/15 10:09 AM  Result Value Ref Range   WBC 7.3 3.4 - 10.8 x10E3/uL   RBC 4.87 4.14 - 5.80 x10E6/uL   Hemoglobin 16.1 12.6 - 17.7 g/dL   Hematocrit 45.8 37.5 - 51.0 %   MCV 94 79 - 97 fL   MCH 33.1 (H) 26.6 - 33.0 pg   MCHC 35.2 31.5 - 35.7 g/dL   RDW 12.5 12.3 - 15.4 %   Platelets 196 150 - 379 x10E3/uL   Neutrophils 49 %   Lymphs 38 %   Monocytes 10 %   Eos 2 %   Basos 1 %   Neutrophils Absolute 3.5 1.4 - 7.0 x10E3/uL   Lymphocytes Absolute 2.8 0.7 - 3.1 x10E3/uL   Monocytes Absolute 0.8 0.1 - 0.9 x10E3/uL  EOS (ABSOLUTE) 0.2 0.0 - 0.4 x10E3/uL   Basophils Absolute 0.1 0.0 - 0.2 x10E3/uL   Immature Granulocytes 0 %   Immature Grans (Abs) 0.0 0.0 - 0.1 x10E3/uL  Comprehensive Metabolic Panel (CMET)     Status: Abnormal   Collection Time: 02/25/15 10:09 AM  Result Value Ref Range   Glucose 213 (H) 65 - 99 mg/dL    Comment: Specimen received in contact with cells. No visible hemolysis present.  However GLUC may be decreased and K increased. Clinical correlation indicated.    BUN 12 6 - 24 mg/dL   Creatinine, Ser 0.91 0.76 - 1.27 mg/dL   GFR calc non Af Amer 105 >59 mL/min/1.73   GFR calc Af Amer 121 >59 mL/min/1.73   BUN/Creatinine Ratio 13 9 - 20   Sodium 139 136 - 144 mmol/L   Potassium 5.0 3.5 - 5.2 mmol/L   Chloride 98 97 - 106 mmol/L   CO2 22 18 - 29 mmol/L   Calcium 9.8 8.7 - 10.2 mg/dL   Total Protein 7.4 6.0 - 8.5 g/dL   Albumin 4.4 3.5 - 5.5 g/dL   Globulin, Total 3.0 1.5 - 4.5 g/dL   Albumin/Globulin Ratio 1.5 1.1 - 2.5   Bilirubin Total 0.8 0.0 - 1.2 mg/dL   Alkaline Phosphatase 120 (H) 39 - 117 IU/L   AST 72 (H) 0 - 40 IU/L   ALT 71 (H) 0 - 44 IU/L  HgB A1c     Status: Abnormal   Collection Time: 02/25/15 10:09 AM  Result Value Ref Range   Hgb A1c MFr Bld 9.0 (H) 4.8 - 5.6 %    Comment:          Pre-diabetes: 5.7 - 6.4          Diabetes: >6.4          Glycemic control for adults with diabetes: <7.0    Est. average glucose Bld gHb Est-mCnc 212 mg/dL  Comment     Status: None   Collection Time: 02/25/15 10:09 AM  Result Value Ref Range   Comment Comment     Comment: According to ATP-III Guidelines, HDL-C >59 mg/dL is considered a negative risk factor for CHD.   CBC with Differential     Status: Abnormal   Collection Time: 03/11/15  2:45 PM  Result Value Ref Range   WBC 5.9 3.4 - 10.8 x10E3/uL   RBC 4.87 4.14 - 5.80 x10E6/uL   Hemoglobin 16.3 12.6 - 17.7 g/dL   Hematocrit 46.0 37.5 - 51.0 %   MCV 95 79 - 97 fL   MCH 33.5 (H) 26.6 - 33.0 pg   MCHC 35.4 31.5 - 35.7 g/dL   RDW 12.6 12.3 - 15.4 %   Platelets 232 150 - 379 x10E3/uL   Neutrophils 45 %   Lymphs 40 %   Monocytes 12 %   Eos 2 %   Basos 1 %   Neutrophils Absolute 2.7 1.4 - 7.0 x10E3/uL   Lymphocytes Absolute 2.4 0.7 - 3.1 x10E3/uL   Monocytes Absolute 0.7 0.1 - 0.9 x10E3/uL   EOS (ABSOLUTE) 0.1 0.0 - 0.4 x10E3/uL   Basophils Absolute 0.1 0.0 - 0.2 x10E3/uL   Immature Granulocytes 0  %   Immature Grans (Abs) 0.0 0.0 - 0.1 x10E3/uL  Comprehensive Metabolic Panel (CMET)     Status: Abnormal   Collection Time: 03/11/15  2:45 PM  Result Value Ref Range   Glucose 163 (H) 65 - 99 mg/dL   BUN 17 6 -  24 mg/dL   Creatinine, Ser 1.17 0.76 - 1.27 mg/dL   GFR calc non Af Amer 78 >59 mL/min/1.73   GFR calc Af Amer 90 >59 mL/min/1.73   BUN/Creatinine Ratio 15 9 - 20   Sodium 141 136 - 144 mmol/L    Comment: **Effective March 23, 2015 the reference interval**   for Sodium, Serum will be changing to:                                             134 - 144    Potassium 4.3 3.5 - 5.2 mmol/L   Chloride 98 97 - 106 mmol/L    Comment: **Effective March 23, 2015 the reference interval**   for Chloride, Serum will be changing to:                                              96 - 106    CO2 25 18 - 29 mmol/L   Calcium 10.0 8.7 - 10.2 mg/dL   Total Protein 8.2 6.0 - 8.5 g/dL   Albumin 4.9 3.5 - 5.5 g/dL   Globulin, Total 3.3 1.5 - 4.5 g/dL   Albumin/Globulin Ratio 1.5 1.1 - 2.5   Bilirubin Total 0.9 0.0 - 1.2 mg/dL   Alkaline Phosphatase 103 39 - 117 IU/L   AST 62 (H) 0 - 40 IU/L   ALT 72 (H) 0 - 44 IU/L  Hepatic function panel     Status: None   Collection Time: 03/11/15  2:45 PM  Result Value Ref Range   Bilirubin, Direct 0.22 0.00 - 0.40 mg/dL  Hepatitis panel, acute     Status: None   Collection Time: 03/11/15  2:45 PM  Result Value Ref Range   Hep A IgM Negative Negative   Hepatitis B Surface Ag Negative Negative   Hep B C IgM Negative Negative   Hep C Virus Ab <0.1 0.0 - 0.9 s/co ratio    Comment:                                   Negative:     < 0.8                              Indeterminate: 0.8 - 0.9                                   Positive:     > 0.9  The CDC recommends that a positive HCV antibody result  be followed up with a HCV Nucleic Acid Amplification  test (992426).   Gamma GT     Status: None   Collection Time: 03/11/15  2:45 PM  Result Value  Ref Range   GGT 64 0 - 65 IU/L  CBC     Status: None   Collection Time: 04/06/15  7:31 AM  Result Value Ref Range   WBC 7.3 3.8 - 10.6 K/uL   RBC 4.93 4.40 - 5.90 MIL/uL   Hemoglobin 16.4 13.0 - 18.0 g/dL   HCT 46.5 40.0 -  52.0 %   MCV 94.3 80.0 - 100.0 fL   MCH 33.2 26.0 - 34.0 pg   MCHC 35.2 32.0 - 36.0 g/dL   RDW 12.3 11.5 - 14.5 %   Platelets 186 150 - 440 K/uL  Troponin I     Status: None   Collection Time: 04/06/15  7:31 AM  Result Value Ref Range   Troponin I <0.03 <0.031 ng/mL    Comment:        NO INDICATION OF MYOCARDIAL INJURY.   Comprehensive metabolic panel     Status: Abnormal   Collection Time: 04/06/15  7:31 AM  Result Value Ref Range   Sodium 140 135 - 145 mmol/L   Potassium 4.1 3.5 - 5.1 mmol/L   Chloride 106 101 - 111 mmol/L   CO2 27 22 - 32 mmol/L   Glucose, Bld 118 (H) 65 - 99 mg/dL   BUN 19 6 - 20 mg/dL   Creatinine, Ser 0.92 0.61 - 1.24 mg/dL   Calcium 9.4 8.9 - 10.3 mg/dL   Total Protein 8.2 (H) 6.5 - 8.1 g/dL   Albumin 4.6 3.5 - 5.0 g/dL   AST 32 15 - 41 U/L   ALT 46 17 - 63 U/L   Alkaline Phosphatase 76 38 - 126 U/L   Total Bilirubin 0.4 0.3 - 1.2 mg/dL   GFR calc non Af Amer >60 >60 mL/min   GFR calc Af Amer >60 >60 mL/min    Comment: (NOTE) The eGFR has been calculated using the CKD EPI equation. This calculation has not been validated in all clinical situations. eGFR's persistently <60 mL/min signify possible Chronic Kidney Disease.    Anion gap 7 5 - 15  Lipase, blood     Status: None   Collection Time: 04/06/15  7:31 AM  Result Value Ref Range   Lipase 39 11 - 51 U/L     Assessment & Plan  1. Uncontrolled type 2 diabetes mellitus without complication, without long-term current use of insulin (HCC)  - ACCU-CHEK SOFTCLIX LANCETS lancets; TEST BLOOD SUGAR ONCE DAILY; Refill: 0 - lisinopril (PRINIVIL,ZESTRIL) 10 MG tablet; Take 1 tablet (10 mg total) by mouth daily.  Dispense: 90 tablet; Refill: 0 - ACCU-CHEK AVIVA PLUS test  strip; 1 each by Other route 2 (two) times daily. Use as instructed  Dispense: 60 each; Refill: 5  2. Hypertension, well controlled D/C Atenolol for probable AEs of fatigue and lack of energy. Will ask to taper down to 25 mg for one week before stopping. Increase Lisinopril to 10 mg daily. Recheck in 2 month. - lisinopril (PRINIVIL,ZESTRIL) 10 MG tablet; Take 1 tablet (10 mg total) by mouth daily.  Dispense: 90 tablet; Refill: 0  3. Other fatigue Likely 2/2 Diabetes vs beta blocker AE. Liver enzymes within normal range.  4. Elevated total protein  - Comprehensive Metabolic Panel (CMET)   Aloysius Heinle Asad A. [facility] 04/22/2015 3:59 PM

## 2015-04-23 LAB — COMPREHENSIVE METABOLIC PANEL
ALK PHOS: 82 IU/L (ref 39–117)
ALT: 49 IU/L — ABNORMAL HIGH (ref 0–44)
AST: 37 IU/L (ref 0–40)
Albumin/Globulin Ratio: 1.6 (ref 1.1–2.5)
Albumin: 4.6 g/dL (ref 3.5–5.5)
BILIRUBIN TOTAL: 0.5 mg/dL (ref 0.0–1.2)
BUN/Creatinine Ratio: 17 (ref 9–20)
BUN: 18 mg/dL (ref 6–24)
CHLORIDE: 102 mmol/L (ref 96–106)
CO2: 22 mmol/L (ref 18–29)
CREATININE: 1.05 mg/dL (ref 0.76–1.27)
Calcium: 9.8 mg/dL (ref 8.7–10.2)
GFR calc Af Amer: 102 mL/min/{1.73_m2} (ref >59)
GFR calc non Af Amer: 88 mL/min/{1.73_m2} (ref >59)
GLOBULIN, TOTAL: 2.9 g/dL (ref 1.5–4.5)
Glucose: 105 mg/dL — ABNORMAL HIGH (ref 65–99)
POTASSIUM: 4.8 mmol/L (ref 3.5–5.2)
SODIUM: 142 mmol/L (ref 134–144)
Total Protein: 7.5 g/dL (ref 6.0–8.5)

## 2015-04-27 ENCOUNTER — Ambulatory Visit
Admission: EM | Admit: 2015-04-27 | Discharge: 2015-04-27 | Disposition: A | Discharge status: Home / Self Care | Payer: BLUE CROSS/BLUE SHIELD | Attending: Family Medicine | Admitting: Family Medicine

## 2015-04-27 ENCOUNTER — Encounter: Payer: Self-pay | Admitting: *Deleted

## 2015-04-27 DIAGNOSIS — I10 Essential (primary) hypertension: Secondary | ICD-10-CM | POA: Diagnosis not present

## 2015-04-27 DIAGNOSIS — F419 Anxiety disorder, unspecified: Secondary | ICD-10-CM

## 2015-04-27 NOTE — Discharge Instructions (Signed)
DASH Eating Plan °DASH stands for "Dietary Approaches to Stop Hypertension." The DASH eating plan is a healthy eating plan that has been shown to reduce high blood pressure (hypertension). Additional health benefits may include reducing the risk of type 2 diabetes mellitus, heart disease, and stroke. The DASH eating plan may also help with weight loss. °WHAT DO I NEED TO KNOW ABOUT THE DASH EATING PLAN? °For the DASH eating plan, you will follow these general guidelines: °· Choose foods with a percent daily value for sodium of less than 5% (as listed on the food label). °· Use salt-free seasonings or herbs instead of table salt or sea salt. °· Check with your health care provider or pharmacist before using salt substitutes. °· Eat lower-sodium products, often labeled as "lower sodium" or "no salt added." °· Eat fresh foods. °· Eat more vegetables, fruits, and low-fat dairy products. °· Choose whole grains. Look for the word "whole" as the first word in the ingredient list. °· Choose fish and skinless chicken or turkey more often than red meat. Limit fish, poultry, and meat to 6 oz (170 g) each day. °· Limit sweets, desserts, sugars, and sugary drinks. °· Choose heart-healthy fats. °· Limit cheese to 1 oz (28 g) per day. °· Eat more home-cooked food and less restaurant, buffet, and fast food. °· Limit fried foods. °· Cook foods using methods other than frying. °· Limit canned vegetables. If you do use them, rinse them well to decrease the sodium. °· When eating at a restaurant, ask that your food be prepared with less salt, or no salt if possible. °WHAT FOODS CAN I EAT? °Seek help from a dietitian for individual calorie needs. °Grains °Whole grain or whole wheat bread. Brown rice. Whole grain or whole wheat pasta. Quinoa, bulgur, and whole grain cereals. Low-sodium cereals. Corn or whole wheat flour tortillas. Whole grain cornbread. Whole grain crackers. Low-sodium crackers. °Vegetables °Fresh or frozen vegetables  (raw, steamed, roasted, or grilled). Low-sodium or reduced-sodium tomato and vegetable juices. Low-sodium or reduced-sodium tomato sauce and paste. Low-sodium or reduced-sodium canned vegetables.  °Fruits °All fresh, canned (in natural juice), or frozen fruits. °Meat and Other Protein Products °Ground beef (85% or leaner), grass-fed beef, or beef trimmed of fat. Skinless chicken or turkey. Ground chicken or turkey. Pork trimmed of fat. All fish and seafood. Eggs. Dried beans, peas, or lentils. Unsalted nuts and seeds. Unsalted canned beans. °Dairy °Low-fat dairy products, such as skim or 1% milk, 2% or reduced-fat cheeses, low-fat ricotta or cottage cheese, or plain low-fat yogurt. Low-sodium or reduced-sodium cheeses. °Fats and Oils °Tub margarines without trans fats. Light or reduced-fat mayonnaise and salad dressings (reduced sodium). Avocado. Safflower, olive, or canola oils. Natural peanut or almond butter. °Other °Unsalted popcorn and pretzels. °The items listed above may not be a complete list of recommended foods or beverages. Contact your dietitian for more options. °WHAT FOODS ARE NOT RECOMMENDED? °Grains °White bread. White pasta. White rice. Refined cornbread. Bagels and croissants. Crackers that contain trans fat. °Vegetables °Creamed or fried vegetables. Vegetables in a cheese sauce. Regular canned vegetables. Regular canned tomato sauce and paste. Regular tomato and vegetable juices. °Fruits °Dried fruits. Canned fruit in light or heavy syrup. Fruit juice. °Meat and Other Protein Products °Fatty cuts of meat. Ribs, chicken wings, bacon, sausage, bologna, salami, chitterlings, fatback, hot dogs, bratwurst, and packaged luncheon meats. Salted nuts and seeds. Canned beans with salt. °Dairy °Whole or 2% milk, cream, half-and-half, and cream cheese. Whole-fat or sweetened yogurt. Full-fat   cheeses or blue cheese. Nondairy creamers and whipped toppings. Processed cheese, cheese spreads, or cheese  curds. °Condiments °Onion and garlic salt, seasoned salt, table salt, and sea salt. Canned and packaged gravies. Worcestershire sauce. Tartar sauce. Barbecue sauce. Teriyaki sauce. Soy sauce, including reduced sodium. Steak sauce. Fish sauce. Oyster sauce. Cocktail sauce. Horseradish. Ketchup and mustard. Meat flavorings and tenderizers. Bouillon cubes. Hot sauce. Tabasco sauce. Marinades. Taco seasonings. Relishes. °Fats and Oils °Butter, stick margarine, lard, shortening, ghee, and bacon fat. Coconut, palm kernel, or palm oils. Regular salad dressings. °Other °Pickles and olives. Salted popcorn and pretzels. °The items listed above may not be a complete list of foods and beverages to avoid. Contact your dietitian for more information. °WHERE CAN I FIND MORE INFORMATION? °National Heart, [employer], [employer]: www.nhlbi.nih.gov/health/health-topics/topics/dash/ °  °This information is not intended to replace advice given to you by your health care provider. Make sure you discuss any questions you have with your health care provider. °  °Document Released: 03/17/2011 Document Revised: 04/18/2014 Document Reviewed: 01/30/2013 °Elsevier Interactive Patient Education ©2016 Elsevier Inc. ° °Hypertension °Hypertension, commonly called high blood pressure, is when the force of blood pumping through your arteries is too strong. Your arteries are the blood vessels that carry blood from your heart throughout your body. A blood pressure reading consists of a higher number over a lower number, such as 110/72. The higher number (systolic) is the pressure inside your arteries when your heart pumps. The lower number (diastolic) is the pressure inside your arteries when your heart relaxes. Ideally you want your blood pressure below 120/80. °Hypertension forces your heart to work harder to pump blood. Your arteries may become narrow or stiff. Having untreated or uncontrolled hypertension can cause heart attack, stroke, kidney  disease, and other problems. °RISK FACTORS °Some risk factors for high blood pressure are controllable. Others are not.  °Risk factors you cannot control include:  °· Race. You may be at higher risk if you are African American. °· Age. Risk increases with age. °· Gender. Men are at higher risk than women before age 45 years. After age 65, women are at higher risk than men. °Risk factors you can control include: °· Not getting enough exercise or physical activity. °· Being overweight. °· Getting too much fat, sugar, calories, or salt in your diet. °· Drinking too much alcohol. °SIGNS AND SYMPTOMS °Hypertension does not usually cause signs or symptoms. Extremely high blood pressure (hypertensive crisis) may cause headache, anxiety, shortness of breath, and nosebleed. °DIAGNOSIS °To check if you have hypertension, your health care provider will measure your blood pressure while you are seated, with your arm held at the level of your heart. It should be measured at least twice using the same arm. Certain conditions can cause a difference in blood pressure between your right and left arms. A blood pressure reading that is higher than normal on one occasion does not mean that you need treatment. If it is not clear whether you have high blood pressure, you may be asked to return on a different day to have your blood pressure checked again. Or, you may be asked to monitor your blood pressure at home for 1 or more weeks. °TREATMENT °Treating high blood pressure includes making lifestyle changes and possibly taking medicine. Living a healthy lifestyle can help lower high blood pressure. You may need to change some of your habits. °Lifestyle changes may include: °· Following the DASH diet. This diet is high in fruits, vegetables, and whole   grains. It is low in salt, red meat, and added sugars. °· Keep your sodium intake below 2,300 mg per day. °· Getting at least 30-45 minutes of aerobic exercise at least 4 times per  week. °· Losing weight if necessary. °· Not smoking. °· Limiting alcoholic beverages. °· Learning ways to reduce stress. °Your health care provider may prescribe medicine if lifestyle changes are not enough to get your blood pressure under control, and if one of the following is true: °· You are 18-59 years of age and your systolic blood pressure is above 140. °· You are 60 years of age or older, and your systolic blood pressure is above 150. °· Your diastolic blood pressure is above 90. °· You have diabetes, and your systolic blood pressure is over 140 or your diastolic blood pressure is over 90. °· You have kidney disease and your blood pressure is above 140/90. °· You have heart disease and your blood pressure is above 140/90. °Your personal target blood pressure may vary depending on your medical conditions, your age, and other factors. °HOME CARE INSTRUCTIONS °· Have your blood pressure rechecked as directed by your health care provider.   °· Take medicines only as directed by your health care provider. Follow the directions carefully. Blood pressure medicines must be taken as prescribed. The medicine does not work as well when you skip doses. Skipping doses also puts you at risk for problems. °· Do not smoke.   °· Monitor your blood pressure at home as directed by your health care provider.  °SEEK MEDICAL CARE IF:  °· You think you are having a reaction to medicines taken. °· You have recurrent headaches or feel dizzy. °· You have swelling in your ankles. °· You have trouble with your vision. °SEEK IMMEDIATE MEDICAL CARE IF: °· You develop a severe headache or confusion. °· You have unusual weakness, numbness, or feel faint. °· You have severe chest or abdominal pain. °· You vomit repeatedly. °· You have trouble breathing. °MAKE SURE YOU:  °· Understand these instructions. °· Will watch your condition. °· Will get help right away if you are not doing well or get worse. °  °This information is not intended to  replace advice given to you by your health care provider. Make sure you discuss any questions you have with your health care provider. °  °Document Released: 03/28/2005 Document Revised: 08/12/2014 Document Reviewed: 01/18/2013 °Elsevier Interactive Patient Education ©2016 Elsevier Inc. ° °Managing Your High Blood Pressure °Blood pressure is a measurement of how forceful your blood is pressing against the walls of the arteries. Arteries are muscular tubes within the circulatory system. Blood pressure does not stay the same. Blood pressure rises when you are active, excited, or nervous; and it lowers during sleep and relaxation. If the numbers measuring your blood pressure stay above normal most of the time, you are at risk for health problems. High blood pressure (hypertension) is a long-term (chronic) condition in which blood pressure is elevated. °A blood pressure reading is recorded as two numbers, such as 120 over 80 (or 120/80). The first, higher number is called the systolic pressure. It is a measure of the pressure in your arteries as the heart beats. The second, lower number is called the diastolic pressure. It is a measure of the pressure in your arteries as the heart relaxes between beats.  °Keeping your blood pressure in a normal range is important to your overall health and prevention of health problems, such as heart disease and stroke.   When your blood pressure is uncontrolled, your heart has to work harder than normal. High blood pressure is a very common condition in adults because blood pressure tends to rise with age. Men and women are equally likely to have hypertension but at different times in life. Before age 45, men are more likely to have hypertension. After 41 years of age, women are more likely to have it. Hypertension is especially common in African Americans. This condition often has no signs or symptoms. The cause of the condition is usually not known. Your caregiver can help you come up  with a plan to keep your blood pressure in a normal, healthy range. °BLOOD PRESSURE STAGES °Blood pressure is classified into four stages: normal, prehypertension, stage 1, and stage 2. Your blood pressure reading will be used to determine what type of treatment, if any, is necessary. Appropriate treatment options are tied to these four stages:  °Normal °· Systolic pressure (mm Hg): below 120. °· Diastolic pressure (mm Hg): below 80. °Prehypertension °· Systolic pressure (mm Hg): 120 to 139. °· Diastolic pressure (mm Hg): 80 to 89. °Stage 1 °· Systolic pressure (mm Hg): 140 to 159. °· Diastolic pressure (mm Hg): 90 to 99. °Stage 2 °· Systolic pressure (mm Hg): 160 or above. °· Diastolic pressure (mm Hg): 100 or above. °RISKS RELATED TO HIGH BLOOD PRESSURE °Managing your blood pressure is an important responsibility. Uncontrolled high blood pressure can lead to: °· A heart attack. °· A stroke. °· A weakened blood vessel (aneurysm). °· Heart failure. °· Kidney damage. °· Eye damage. °· Metabolic syndrome. °· Memory and concentration problems. °HOW TO MANAGE YOUR BLOOD PRESSURE °Blood pressure can be managed effectively with lifestyle changes and medicines (if needed). Your caregiver will help you come up with a plan to bring your blood pressure within a normal range. Your plan should include the following: °Education °· Read all information provided by your caregivers about how to control blood pressure. °· Educate yourself on the latest guidelines and treatment recommendations. New research is always being done to further define the risks and treatments for high blood pressure. °Lifestyle changes °· Control your weight. °· Avoid smoking. °· Stay physically active. °· Reduce the amount of salt in your diet. °· Reduce stress. °· Control any chronic conditions, such as high cholesterol or diabetes. °· Reduce your alcohol intake. °Medicines °· Several medicines (antihypertensive medicines) are available, if needed, to  bring blood pressure within a normal range. °Communication °· Review all the medicines you take with your caregiver because there may be side effects or interactions. °· Talk with your caregiver about your diet, exercise habits, and other lifestyle factors that may be contributing to high blood pressure. °· See your caregiver regularly. Your caregiver can help you create and adjust your plan for managing high blood pressure. °RECOMMENDATIONS FOR TREATMENT AND FOLLOW-UP  °The following recommendations are based on current guidelines for managing high blood pressure in nonpregnant adults. Use these recommendations to identify the proper follow-up period or treatment option based on your blood pressure reading. You can discuss these options with your caregiver. °· Systolic pressure of 120 to 139 or diastolic pressure of 80 to 89: Follow up with your caregiver as directed. °· Systolic pressure of 140 to 160 or diastolic pressure of 90 to 100: Follow up with your caregiver within 2 months. °· Systolic pressure above 160 or diastolic pressure above 100: Follow up with your caregiver within 1 month. °· Systolic pressure above 180 or diastolic pressure above 110:   Consider antihypertensive therapy; follow up with your caregiver within 1 week. °· Systolic pressure above 200 or diastolic pressure above 120: Begin antihypertensive therapy; follow up with your caregiver within 1 week. °  °This information is not intended to replace advice given to you by your health care provider. Make sure you discuss any questions you have with your health care provider. °  °Document Released: 12/21/2011 Document Reviewed: 12/21/2011 °Elsevier Interactive Patient Education ©2016 Elsevier Inc. ° °

## 2015-04-27 NOTE — ED Provider Notes (Signed)
CSN: [id number]     Arrival date & time 04/27/15  4562 History   First MD Initiated Contact with Patient 04/27/15 1129     Chief Complaint  Patient presents with  . Hypertension  . Dizziness   (Consider location/radiation/quality/duration/timing/severity/associated sxs/prior Treatment) HPI   This [age]-year-old male who presents with elevated blood pressure and dizziness which she actually states is more of a lightheadedness. He's been under the care of Dr. Brigitte Pulse at [facility] for blood pressure and heart rate issues. States that he was on atenolol 50 mg daily up until Sunday at which time he was taking half doses and now is completely off. He was then placed on lisinopril 10 mg on Thursday. It was 4 days ago. Since that time he's noticed that his blood pressure has elevated 148/101 and he has had the lightheadedness. He has a stressful job at the [employer] at [employer]. Currently in the process of inventory of the store. Attempted to see Dr. Brigitte Pulse today but was told he was booked he came here. He typically will check the blood pressure twice a day in the morning and at bedtime. Is also concerned regarding his father's history of coronary disease at early age.  Past Medical History  Diagnosis Date  . Hypertension   . Diabetes mellitus without complication ([place])   . Gallstones    History reviewed. No pertinent past surgical history. Family History  Problem Relation Age of Onset  . Hypertension Father   . Heart disease Father     Triple bypass at 62 y.o   Social History  Substance Use Topics  . Smoking status: Never Smoker   . Smokeless tobacco: Never Used  . Alcohol Use: No    Review of Systems  Constitutional: Negative for fever, diaphoresis, activity change and fatigue.  Cardiovascular: Negative for chest pain, palpitations and leg swelling.  Psychiatric/Behavioral: The patient is nervous/anxious.   All other systems reviewed and are negative.   Allergies   Review of patient's allergies indicates no known allergies.  Home Medications   Prior to Admission medications   Medication Sig Start Date End Date Taking? Authorizing Provider  aspirin 81 MG tablet Take 81 mg by mouth daily.   Yes Historical Provider, MD  glipiZIDE (GLUCOTROL) 5 MG tablet Take 1 tablet (5 mg total) by mouth daily before breakfast. 03/11/15  Yes Roselee Nova, MD  lisinopril (PRINIVIL,ZESTRIL) 10 MG tablet Take 1 tablet (10 mg total) by mouth daily. 04/22/15  Yes Roselee Nova, MD  omeprazole (PRILOSEC) 20 MG capsule Take 1 capsule (20 mg total) by mouth daily. 04/06/15 04/05/16 Yes Lavonia Drafts, MD  ACCU-CHEK AVIVA PLUS test strip 1 each by Other route 2 (two) times daily. Use as instructed 04/22/15   Roselee Nova, MD  ACCU-CHEK SOFTCLIX LANCETS lancets TEST BLOOD SUGAR ONCE DAILY 03/12/15   Historical Provider, MD  Blood Glucose Monitoring Suppl (ACCU-CHEK AVIVA PLUS) w/Device KIT FPD 03/12/15   Historical Provider, MD   Meds Ordered and Administered this Visit  Medications - No data to display  BP 144/98 mmHg  Pulse 98  Temp(Src) 98 F (36.7 C) (Oral)  Resp 20  Ht 5' 11"  (1.803 m)  Wt 215 lb (97.523 kg)  BMI 30.00 kg/m2  SpO2 100% Orthostatic VS for the past 24 hrs:  BP- Lying Pulse- Lying BP- Sitting Pulse- Sitting BP- Standing at 0 minutes Pulse- Standing at 0 minutes  04/27/15 1033 (!) 157/93 mmHg  102 (!) 149/99 mmHg 105 (!) 147/91 mmHg 108    Physical Exam  Constitutional: He is oriented to person, place, and time. He appears well-developed and well-nourished. No distress.  HENT:  Head: Normocephalic and atraumatic.  Eyes: Conjunctivae are normal. Pupils are equal, round, and reactive to light. Right eye exhibits no discharge. Left eye exhibits no discharge.  Neck: Normal range of motion. Neck supple.  Cardiovascular: Normal rate, regular rhythm, normal heart sounds and intact distal pulses.  Exam reveals no gallop.   No murmur  heard. Pulmonary/Chest: Effort normal and breath sounds normal. No respiratory distress. He has no wheezes. He has no rales.  Musculoskeletal: Normal range of motion. He exhibits no edema or tenderness.  Neurological: He is alert and oriented to person, place, and time.  Skin: Skin is warm and dry. He is not diaphoretic.  Psychiatric: He has a normal mood and affect. His behavior is normal. Judgment and thought content normal.  Nursing note and vitals reviewed.   ED Course  Procedures (including critical care time)  Labs Review Labs Reviewed - No data to display  Imaging Review No results found.   Visual Acuity Review  Right Eye Distance:   Left Eye Distance:   Bilateral Distance:    Right Eye Near:   Left Eye Near:    Bilateral Near:         MDM   1. Essential hypertension   2. Anxiety    Discharge Medication List as of 04/27/2015 12:03 PM    Plan: 1. Diagnosis reviewed with patient 2. rx as per orders; risks, benefits, potential side effects reviewed with patient 3. Recommend supportive treatment with education increase fluid intake. Recommend exercises and meditation. I've also recommended that he consider a start taking a quarter of his usual atenolol tablet until he establishes contact him at Dr. Brigitte Pulse. Also advised him that it takes 3-4 weeks for lisinopril to exert its final affect and does not have any effect on his heart rate. We had a long discussion regarding hypertension and not taking his blood pressure so frequently which reduces stress itself. I recommended 3 times a week at random times and recording the daytime and blood pressure into a locked to present to Dr. Brigitte Pulse for his review. Is reassured that his blood pressure this point is not dangerous. His blood pressure was retaken prior to his departure found to be 149/99. 4. F/u prn if symptoms worsen or don't improve     Lorin Picket, PA-C 04/27/15 1209

## 2015-04-27 NOTE — ED Notes (Signed)
Patient started having symptoms of hypertension and dizziness this AM. Patient started his new heart medication routine yesterday.

## 2015-04-29 ENCOUNTER — Telehealth: Payer: Self-pay

## 2015-04-29 NOTE — Telephone Encounter (Signed)
Routed to Dr. Shah for medication advice 

## 2015-05-05 NOTE — Telephone Encounter (Signed)
Spoke with patient and he has been taking atenolol 1/4 tablet twice daily and he feels well. He will follow-up at his usual appointment.

## 2015-05-27 ENCOUNTER — Ambulatory Visit: Payer: BLUE CROSS/BLUE SHIELD | Admitting: Family Medicine

## 2015-06-03 ENCOUNTER — Telehealth: Payer: Self-pay | Admitting: Family Medicine

## 2015-06-03 ENCOUNTER — Other Ambulatory Visit: Payer: Self-pay | Admitting: Family Medicine

## 2015-06-03 MED ORDER — OMEPRAZOLE 20 MG PO CPDR: 20.0000 mg | DELAYED_RELEASE_CAPSULE | Freq: Every day | ORAL | Status: AC

## 2015-06-03 NOTE — Telephone Encounter (Signed)
Medication has been refilled and sent to [facility] [address] 

## 2015-06-03 NOTE — Telephone Encounter (Signed)
Patient has appointment in March but only have 3 pills left of Glipizide. Requesting a refill to be sent to [facility] Also patient was seen at the ER on Dec 26 and was given a prescription for omeprazole, he is also requesting a refill to be sent to [facility] [address]

## 2015-06-24 ENCOUNTER — Encounter: Payer: Self-pay | Admitting: Family Medicine

## 2015-06-24 ENCOUNTER — Ambulatory Visit (INDEPENDENT_AMBULATORY_CARE_PROVIDER_SITE_OTHER): Payer: BLUE CROSS/BLUE SHIELD | Admitting: Family Medicine

## 2015-06-24 VITALS — BP 118/78 | HR 69 | Temp 98.4°F | Resp 20 | Ht 71.0 in | Wt 211.2 lb

## 2015-06-24 DIAGNOSIS — E785 Hyperlipidemia, unspecified: Secondary | ICD-10-CM | POA: Diagnosis not present

## 2015-06-24 DIAGNOSIS — I10 Essential (primary) hypertension: Secondary | ICD-10-CM | POA: Insufficient documentation

## 2015-06-24 DIAGNOSIS — E109 Type 1 diabetes mellitus without complications: Secondary | ICD-10-CM

## 2015-06-24 DIAGNOSIS — R748 Abnormal levels of other serum enzymes: Secondary | ICD-10-CM

## 2015-06-24 LAB — POCT GLYCOSYLATED HEMOGLOBIN (HGB A1C): Hemoglobin A1C: 5.6

## 2015-06-24 LAB — GLUCOSE, POCT (MANUAL RESULT ENTRY): POC Glucose: 83 mg/dl (ref 70–99)

## 2015-06-24 MED ORDER — LISINOPRIL 10 MG PO TABS: 10.0000 mg | ORAL_TABLET | Freq: Every day | ORAL | Status: DC

## 2015-06-24 NOTE — Progress Notes (Signed)
Name: Hunter Rodriguez   MRN: [id number]    DOB: Mar 31, 1976   Date:06/24/2015       Progress Note  Subjective  Chief Complaint  Chief Complaint  Patient presents with  . Diabetes    pt here for 2 month follow up    Diabetes He presents for his follow-up diabetic visit. He has type 2 diabetes mellitus. His disease course has been improving. Hypoglycemia symptoms include sweats (shakes especially when he would wake up at night). Pertinent negatives for hypoglycemia include no headaches. Pertinent negatives for diabetes include no chest pain, no fatigue, no polydipsia and no polyuria. Symptoms are improving. Current diabetic treatment includes diet and oral agent (monotherapy) (Avoiding all sodas, eats sugar free chocolate, reduced pastas and rice.). His weight is decreasing steadily. He is following a diabetic and generally healthy diet. His breakfast blood glucose range is generally 90-110 mg/dl.  Hypertension This is a chronic problem. The problem is unchanged. The problem is controlled. Associated symptoms include sweats (shakes especially when he would wake up at night). Pertinent negatives include no chest pain, headaches or palpitations. Past treatments include ACE inhibitors.     Past Medical History  Diagnosis Date  . Hypertension   . Diabetes mellitus without complication ([facility])   . Gallstones     No past surgical history on file.  Family History  Problem Relation Age of Onset  . Hypertension Father   . Heart disease Father     Triple bypass at 40 y.o    Social History   Social History  . Marital Status: Married    Spouse Name: N/A  . Number of Children: N/A  . Years of Education: N/A   Occupational History  . Not on file.   Social History Main Topics  . Smoking status: Never Smoker   . Smokeless tobacco: Never Used  . Alcohol Use: No  . Drug Use: No  . Sexual Activity:    Partners: Female   Other Topics Concern  . Not on file   Social History Narrative      Current outpatient prescriptions:  .  ACCU-CHEK AVIVA PLUS test strip, 1 each by Other route 2 (two) times daily. Use as instructed, Disp: 60 each, Rfl: 5 .  ACCU-CHEK SOFTCLIX LANCETS lancets, TEST BLOOD SUGAR ONCE DAILY, Disp: , Rfl: 0 .  aspirin 81 MG tablet, Take 81 mg by mouth daily., Disp: , Rfl:  .  Blood Glucose Monitoring Suppl (ACCU-CHEK AVIVA PLUS) w/Device KIT, FPD, Disp: , Rfl: 0 .  glipiZIDE (GLUCOTROL) 5 MG tablet, Take 1 tablet (5 mg total) by mouth daily before breakfast., Disp: 90 tablet, Rfl: 0 .  lisinopril (PRINIVIL,ZESTRIL) 10 MG tablet, Take 1 tablet (10 mg total) by mouth daily., Disp: 90 tablet, Rfl: 0 .  omeprazole (PRILOSEC) 20 MG capsule, Take 1 capsule (20 mg total) by mouth daily., Disp: 90 capsule, Rfl: 0  No Known Allergies   Review of Systems  Constitutional: Negative for fatigue.  Cardiovascular: Negative for chest pain and palpitations.  Neurological: Negative for headaches.  Endo/Heme/Allergies: Negative for polydipsia.     Objective  Filed Vitals:   06/24/15 0929  BP: 118/78  Pulse: 69  Temp: 98.4 F (36.9 C)  Resp: 20  Height: _0  (1.803 m)  Weight: 211 lb 4 oz (95.822 kg)  SpO2: 98%    Physical Exam  Constitutional: He is oriented to person, place, and time and well-developed, well-nourished, and in no distress.  HENT:  Head: Normocephalic  and atraumatic.  Cardiovascular: Normal rate, regular rhythm and normal heart sounds.   Pulmonary/Chest: Effort normal and breath sounds normal.  Neurological: He is alert and oriented to person, place, and time.  Nursing note and vitals reviewed.      Assessment & Plan  1. Diabetes mellitus type 1, controlled, without complications (HCC) F4F is 5.6%, considered normal. Advised to continue with Dietary and lifestyle changes that he has made and he may discontinue glipizide because of low blood sugars. Advised to check his blood glucose every day and if his fasting blood sugars  persistently above 110-120, to contact us for further follow-up. - POCT HgB A1C - POCT Glucose (CBG)  2. Elevated liver enzymes Recheck liver enzymes today. - Comprehensive Metabolic Panel (CMET)  3. Hyperlipidemia Check lipid profile and consider starting on statin therapy for primary prevention - Lipid Profile  4. Essential hypertension Blood pressure at goal on present therapy. - atenolol (TENORMIN) 25 MG tablet; Take by mouth daily. - lisinopril (PRINIVIL,ZESTRIL) 10 MG tablet; Take 1 tablet (10 mg total) by mouth daily.  Dispense: 90 tablet; Refill: 0  Patient was started on omeprazole by the doctor at urgent care for complaints of heartburn. Advised to take Zantac 150 milligrams every day and he may discontinue taking omeprazole. Follow up if symptoms recur.  Larisa Lanius Asad A. [facility] 06/24/2015 10:10 AM

## 2015-06-25 LAB — COMPREHENSIVE METABOLIC PANEL
A/G RATIO: 1.4 (ref 1.2–2.2)
ALK PHOS: 74 IU/L (ref 39–117)
ALT: 38 IU/L (ref 0–44)
AST: 30 IU/L (ref 0–40)
Albumin: 4.7 g/dL (ref 3.5–5.5)
BILIRUBIN TOTAL: 0.9 mg/dL (ref 0.0–1.2)
BUN/Creatinine Ratio: 21 — ABNORMAL HIGH (ref 9–20)
BUN: 21 mg/dL (ref 6–24)
CHLORIDE: 100 mmol/L (ref 96–106)
CO2: 25 mmol/L (ref 18–29)
Calcium: 10.2 mg/dL (ref 8.7–10.2)
Creatinine, Ser: 1.01 mg/dL (ref 0.76–1.27)
GFR calc non Af Amer: 93 mL/min/{1.73_m2} (ref >59)
GFR, EST AFRICAN AMERICAN: 107 mL/min/{1.73_m2} (ref >59)
Globulin, Total: 3.4 g/dL (ref 1.5–4.5)
Glucose: 98 mg/dL (ref 65–99)
POTASSIUM: 5.2 mmol/L (ref 3.5–5.2)
Sodium: 141 mmol/L (ref 134–144)
Total Protein: 8.1 g/dL (ref 6.0–8.5)

## 2015-06-25 LAB — LIPID PANEL
CHOL/HDL RATIO: 4.9 ratio (ref 0.0–5.0)
Cholesterol, Total: 180 mg/dL (ref 100–199)
HDL: 37 mg/dL — AB (ref >39)
LDL Calculated: 127 mg/dL — ABNORMAL HIGH (ref 0–99)
TRIGLYCERIDES: 79 mg/dL (ref 0–149)
VLDL Cholesterol Cal: 16 mg/dL (ref 5–40)

## 2015-07-01 ENCOUNTER — Telehealth: Payer: Self-pay

## 2015-07-01 NOTE — Telephone Encounter (Signed)
Spoke with patient and he reports his blood sugars are 105-110 mg/dL in the morning. Has discontinued glipizide and is feeling fine. Reassured He will also like to talk about anxiety and will need an appointment on Thursday morning next week. Please schedule.

## 2015-07-01 NOTE — Telephone Encounter (Signed)
Routed to Dr. Sherryll BurgerShah for advice and return patient call

## 2015-07-08 ENCOUNTER — Ambulatory Visit (INDEPENDENT_AMBULATORY_CARE_PROVIDER_SITE_OTHER): Payer: BLUE CROSS/BLUE SHIELD | Admitting: Family Medicine

## 2015-07-08 ENCOUNTER — Encounter: Payer: Self-pay | Admitting: Family Medicine

## 2015-07-08 VITALS — BP 120/74 | HR 85 | Temp 98.8°F | Resp 18 | Ht 71.0 in | Wt 212.1 lb

## 2015-07-08 DIAGNOSIS — E785 Hyperlipidemia, unspecified: Secondary | ICD-10-CM | POA: Diagnosis not present

## 2015-07-08 DIAGNOSIS — F419 Anxiety disorder, unspecified: Secondary | ICD-10-CM | POA: Insufficient documentation

## 2015-07-08 MED ORDER — ROSUVASTATIN CALCIUM 5 MG PO TABS: 5.0000 mg | ORAL_TABLET | Freq: Every day | ORAL | Status: AC

## 2015-07-08 MED ORDER — CLONAZEPAM 0.5 MG PO TABS: 0.2500 mg | ORAL_TABLET | Freq: Every evening | ORAL | Status: AC | PRN

## 2015-07-08 NOTE — Progress Notes (Signed)
Name: Hunter Rodriguez   MRN: [id number]    DOB: May 10, 1975   Date:07/08/2015       Progress Note  Subjective  Chief Complaint  Chief Complaint  Patient presents with  . Follow-up    Cholesterol    HPI  Hyperlipidemia: Patient presents for evaluation and management of elevated cholesterol, LDL was 127, Normal total cholesterol, below normal HDL. Normal Triglycerides.  Pt. Has well-controlled Diabetes. Will be started on low dose statin therapy.  Anxiety: Pt. Reports frequent episodes of anxiety and stress, mainly due to work-related issues. He stresses about work mainly at night, gets anxious, then wakes up early in the morning anxious and stressed out about work. He wants to try something to help relieve the anxiety but also something that wont impair his mental capabilities.     Past Medical History  Diagnosis Date  . Hypertension   . Diabetes mellitus without complication ([place])   . Gallstones     History reviewed. No pertinent past surgical history.  Family History  Problem Relation Age of Onset  . Hypertension Father   . Heart disease Father     Triple bypass at 72 y.o    Social History   Social History  . Marital Status: Married    Spouse Name: N/A  . Number of Children: N/A  . Years of Education: N/A   Occupational History  . Not on file.   Social History Main Topics  . Smoking status: Never Smoker   . Smokeless tobacco: Never Used  . Alcohol Use: No  . Drug Use: No  . Sexual Activity:    Partners: Female   Other Topics Concern  . Not on file   Social History Narrative     Current outpatient prescriptions:  .  ACCU-CHEK AVIVA PLUS test strip, 1 each by Other route 2 (two) times daily. Use as instructed, Disp: 60 each, Rfl: 5 .  ACCU-CHEK SOFTCLIX LANCETS lancets, TEST BLOOD SUGAR ONCE DAILY, Disp: , Rfl: 0 .  aspirin 81 MG tablet, Take 81 mg by mouth daily., Disp: , Rfl:  .  atenolol (TENORMIN) 25 MG tablet, Take by mouth daily., Disp: , Rfl:  .   Blood Glucose Monitoring Suppl (ACCU-CHEK AVIVA PLUS) w/Device KIT, FPD, Disp: , Rfl: 0 .  lisinopril (PRINIVIL,ZESTRIL) 10 MG tablet, Take 1 tablet (10 mg total) by mouth daily., Disp: 90 tablet, Rfl: 0 .  omeprazole (PRILOSEC) 20 MG capsule, Take 1 capsule (20 mg total) by mouth daily., Disp: 90 capsule, Rfl: 0  No Known Allergies   Review of Systems  Psychiatric/Behavioral: Negative for depression. The patient is nervous/anxious and has insomnia.       Objective  Filed Vitals:   07/08/15 1607  BP: 120/74  Pulse: 85  Temp: 98.8 F (37.1 C)  TempSrc: Oral  Resp: 18  Height: 5' 11"  (1.803 m)  Weight: 212 lb 1.6 oz (96.208 kg)  SpO2: 98%    Physical Exam  Constitutional: He is oriented to person, place, and time and well-developed, well-nourished, and in no distress.  HENT:  Head: Normocephalic and atraumatic.  Cardiovascular: Normal rate and regular rhythm.   Pulmonary/Chest: Effort normal and breath sounds normal.  Abdominal: Soft. Bowel sounds are normal.  Neurological: He is alert and oriented to person, place, and time.  Psychiatric: Mood, memory, affect and judgment normal.  Nursing note and vitals reviewed.      Assessment & Plan  1. Hyperlipidemia Start on low-dose statin therapy for treatment of  elevated cholesterol and for primary prevention. - rosuvastatin (CRESTOR) 5 MG tablet; Take 1 tablet (5 mg total) by mouth daily.  Dispense: 90 tablet; Refill: 1  2. Anxiety Start on clonazepam 0.25 mg to be taken at bedtime when needed for relief of stress and anxiety. Patient educated in detail about possible side effects and endurance potential associated with benzodiazepines. Advised to take only as needed, contact us for any concerning symptoms, and follow-up in one month. Patient verbalized agreement. - clonazePAM (KLONOPIN) 0.5 MG tablet; Take 0.5 tablets (0.25 mg total) by mouth at bedtime as needed for anxiety.  Dispense: 30 tablet; Refill: 0   Chaden Doom Asad  A. [facility] 07/08/2015 4:15 PM

## 2015-07-21 ENCOUNTER — Other Ambulatory Visit: Payer: Self-pay | Admitting: Family Medicine

## 2015-08-12 ENCOUNTER — Ambulatory Visit: Payer: BLUE CROSS/BLUE SHIELD | Admitting: Family Medicine

## 2015-08-16 ENCOUNTER — Other Ambulatory Visit: Payer: Self-pay | Admitting: Family Medicine

## 2015-09-01 ENCOUNTER — Other Ambulatory Visit: Payer: Self-pay | Admitting: Family Medicine

## 2015-09-02 ENCOUNTER — Other Ambulatory Visit: Payer: Self-pay | Admitting: Family Medicine

## 2015-09-30 ENCOUNTER — Ambulatory Visit: Payer: BLUE CROSS/BLUE SHIELD | Admitting: Family Medicine

## 2015-11-20 ENCOUNTER — Other Ambulatory Visit: Payer: Self-pay | Admitting: Family Medicine

## 2016-01-12 ENCOUNTER — Other Ambulatory Visit: Payer: Self-pay | Admitting: Family Medicine

## 2016-03-21 ENCOUNTER — Other Ambulatory Visit: Payer: Self-pay | Admitting: Family Medicine

## 2016-04-12 ENCOUNTER — Other Ambulatory Visit: Payer: Self-pay | Admitting: Family Medicine

## 2016-04-12 DIAGNOSIS — I10 Essential (primary) hypertension: Secondary | ICD-10-CM

## 2016-04-16 ENCOUNTER — Other Ambulatory Visit: Payer: Self-pay | Admitting: Family Medicine

## 2016-04-16 DIAGNOSIS — I10 Essential (primary) hypertension: Secondary | ICD-10-CM

## 2016-07-17 IMAGING — US US ABDOMEN LIMITED
1 series · 14 of 25 positions shown · non-contrast
Comparison: None in PACs

CLINICAL DATA: Elevated liver enzymes, history of diabetes and
obesity and hypertension.

EXAM:
US ABDOMEN LIMITED - RIGHT UPPER QUADRANT

[Series 1: us abdomen limited · 0.32mm/px · 14 of 39 slices shown]
[im 1/39]
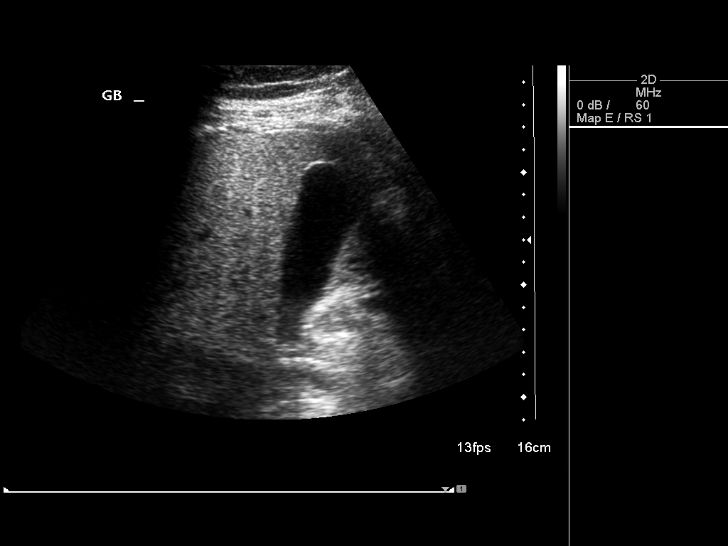
[im 4/39]
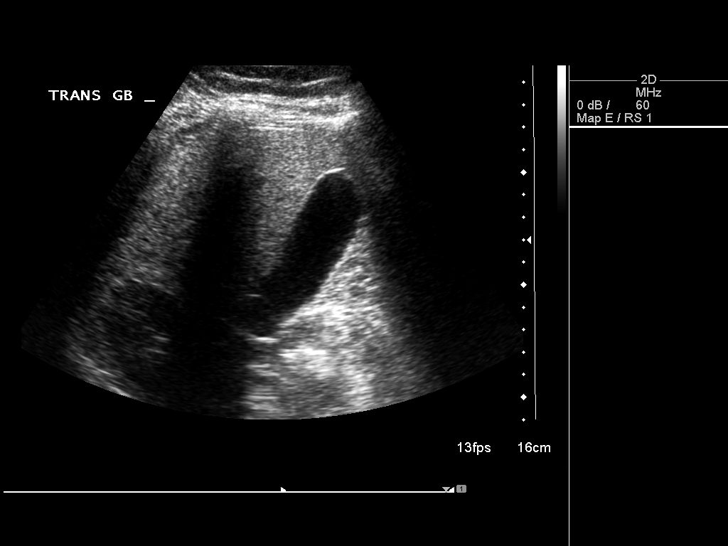
[im 7/39]
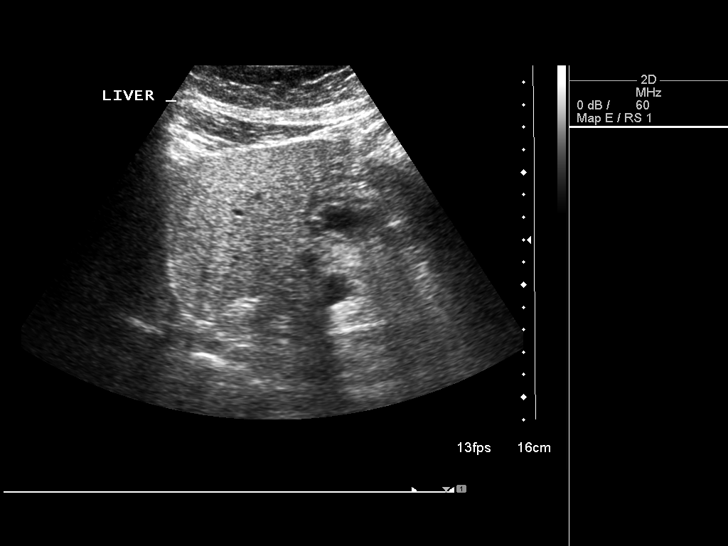
[im 10/39]
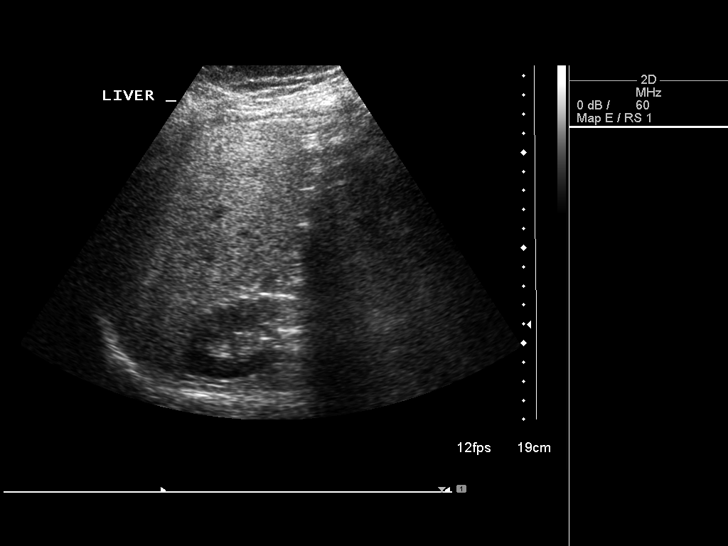
[im 13/39]
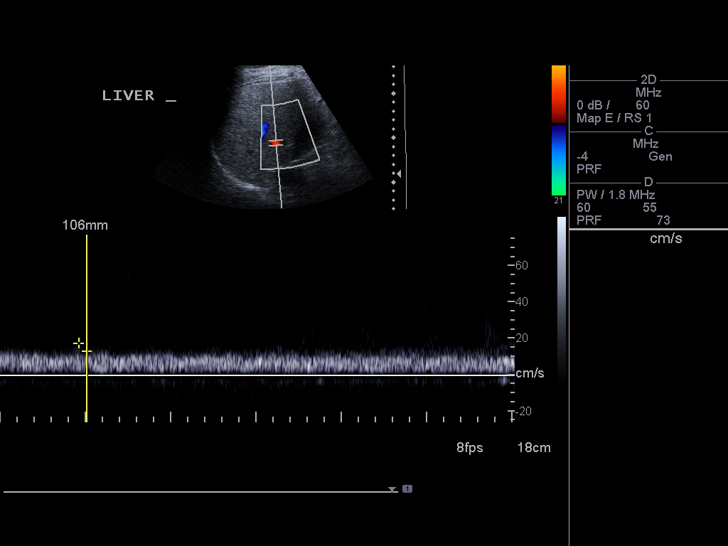
[im 15/39]
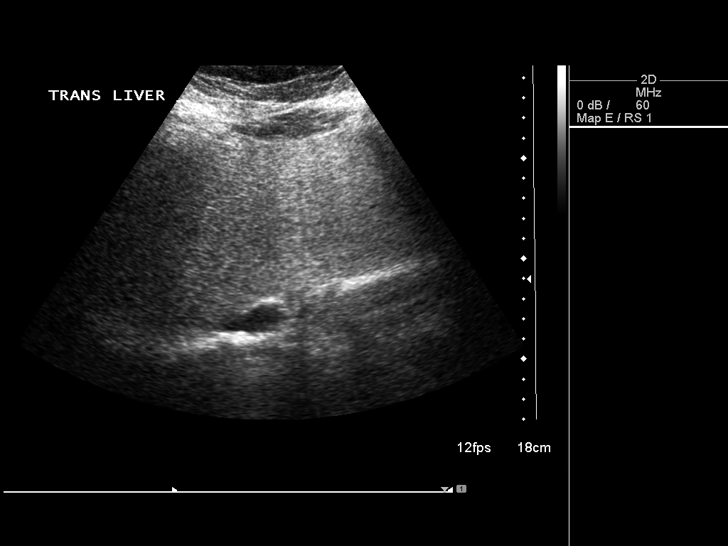
[im 18/39]
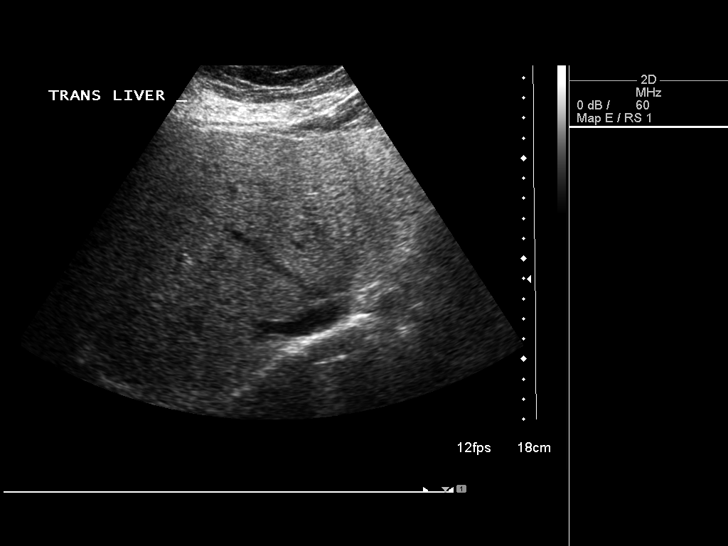
[im 21/39]
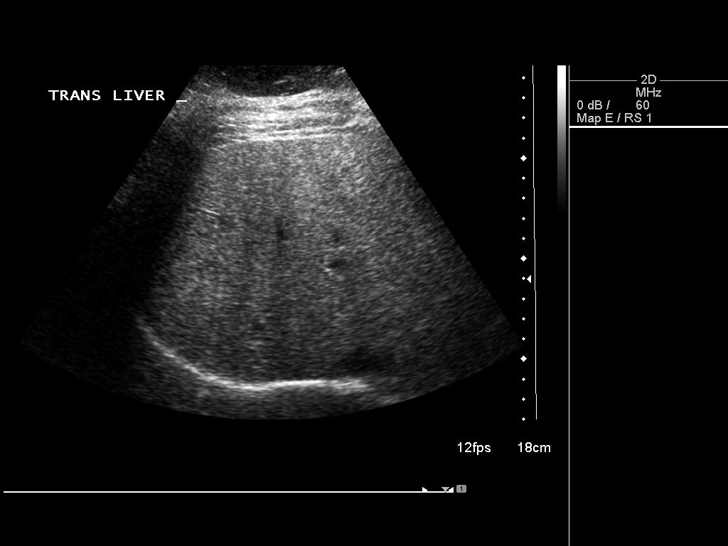
[im 24/39]
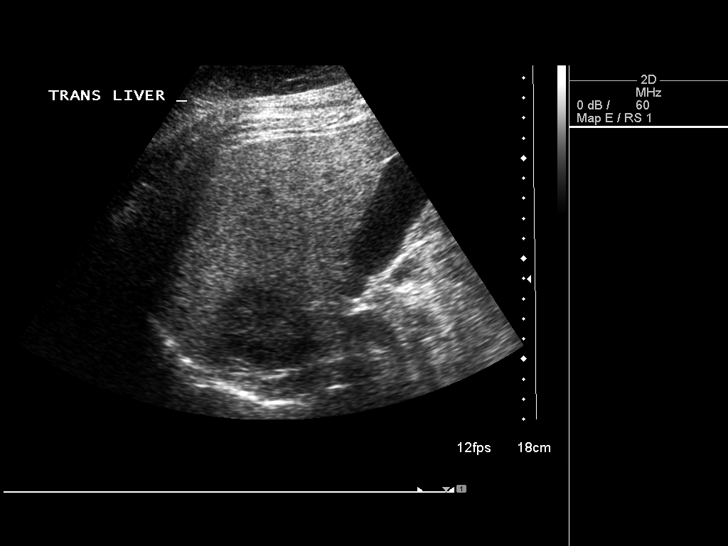
[im 26/39]
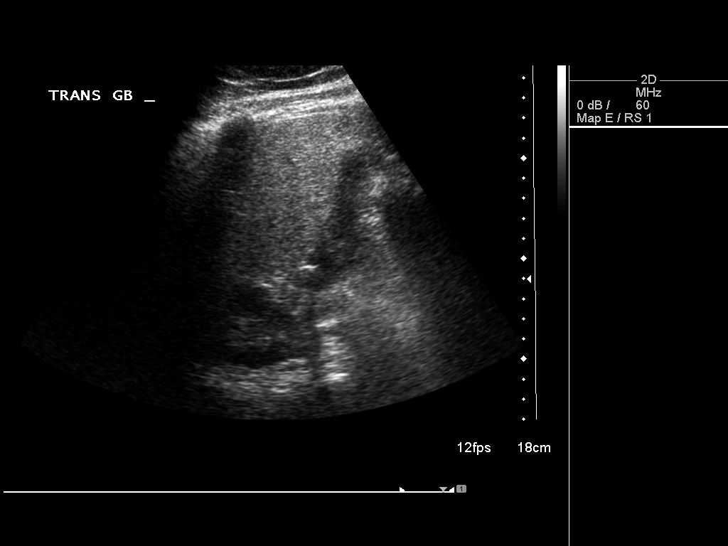
[im 29/39]
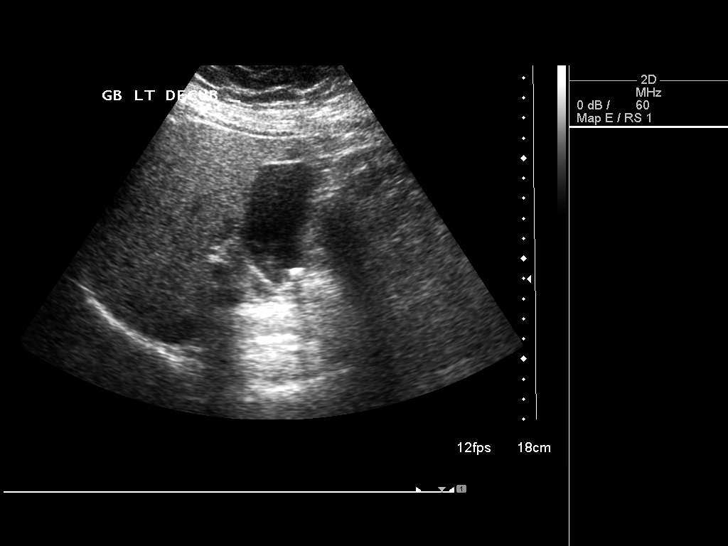
[im 32/39]
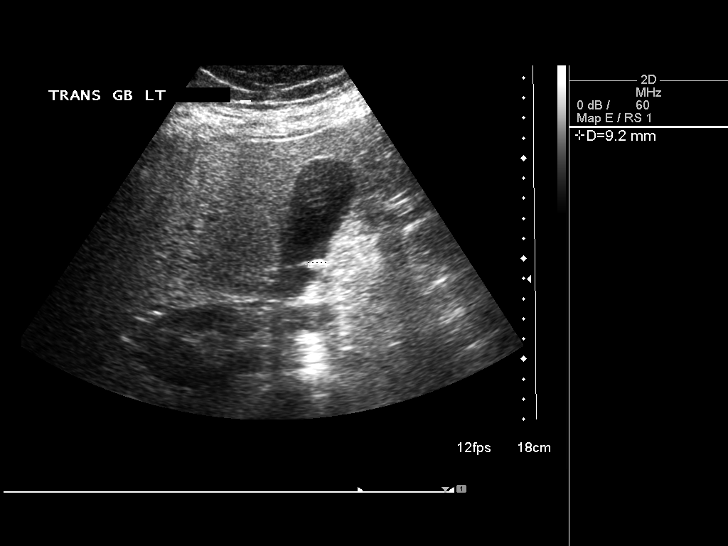
[im 35/39]
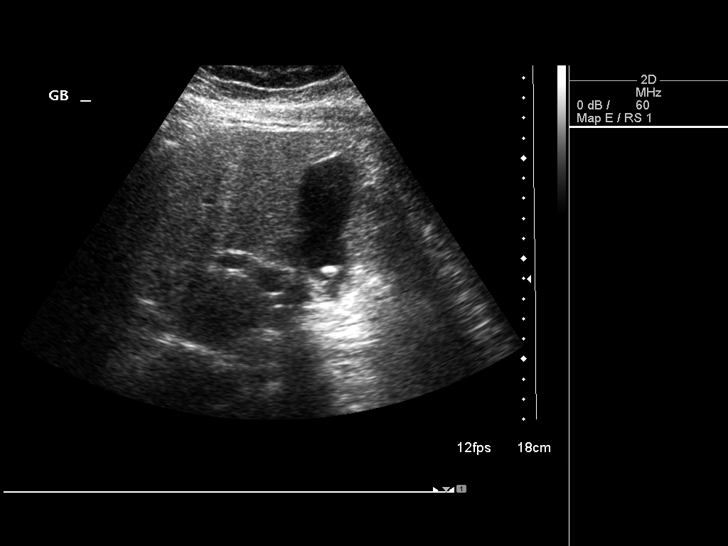
[im 39/39]
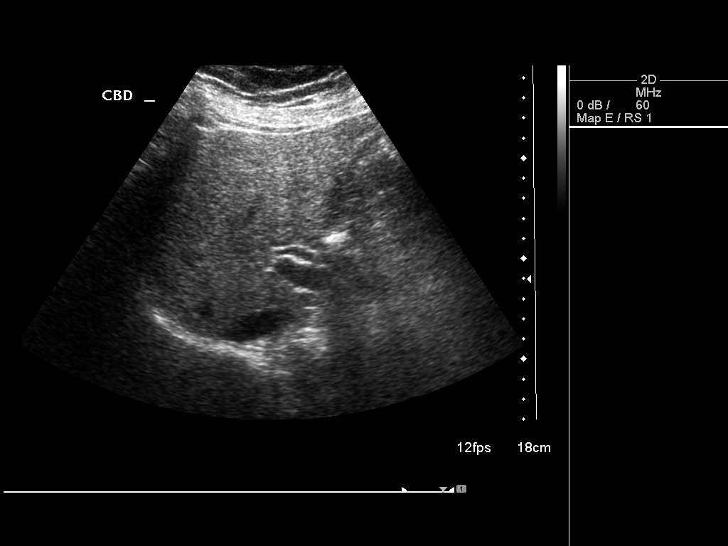

[14 of 25 positions shown; findings below may reference images not displayed]

FINDINGS: Gallbladder:

The gallbladder is adequately distended. There is an echogenic
shadowing 9 mm gallstone which appears mobile. There is no
gallbladder wall thickening, pericholecystic fluid, or positive
sonographic Murphy's sign.

Common bile duct:

Diameter: 5 mm

Liver:

The hepatic echotexture is increased diffusely. There is no focal
mass nor ductal dilation. The surface contour of the liver is
normal.
IMPRESSION: 1. Gallstones without evidence of acute cholecystitis.
2. Fatty infiltrative change of the liver. The common bile duct is
normal.

## 2016-07-29 IMAGING — CR DG CHEST 2V
2 series · 2 of 2 positions shown · non-contrast
Comparison: None.

CLINICAL DATA: Chest pain since yesterday. History of kidney
stones.

EXAM:
CHEST  2 VIEW

[chest pa]
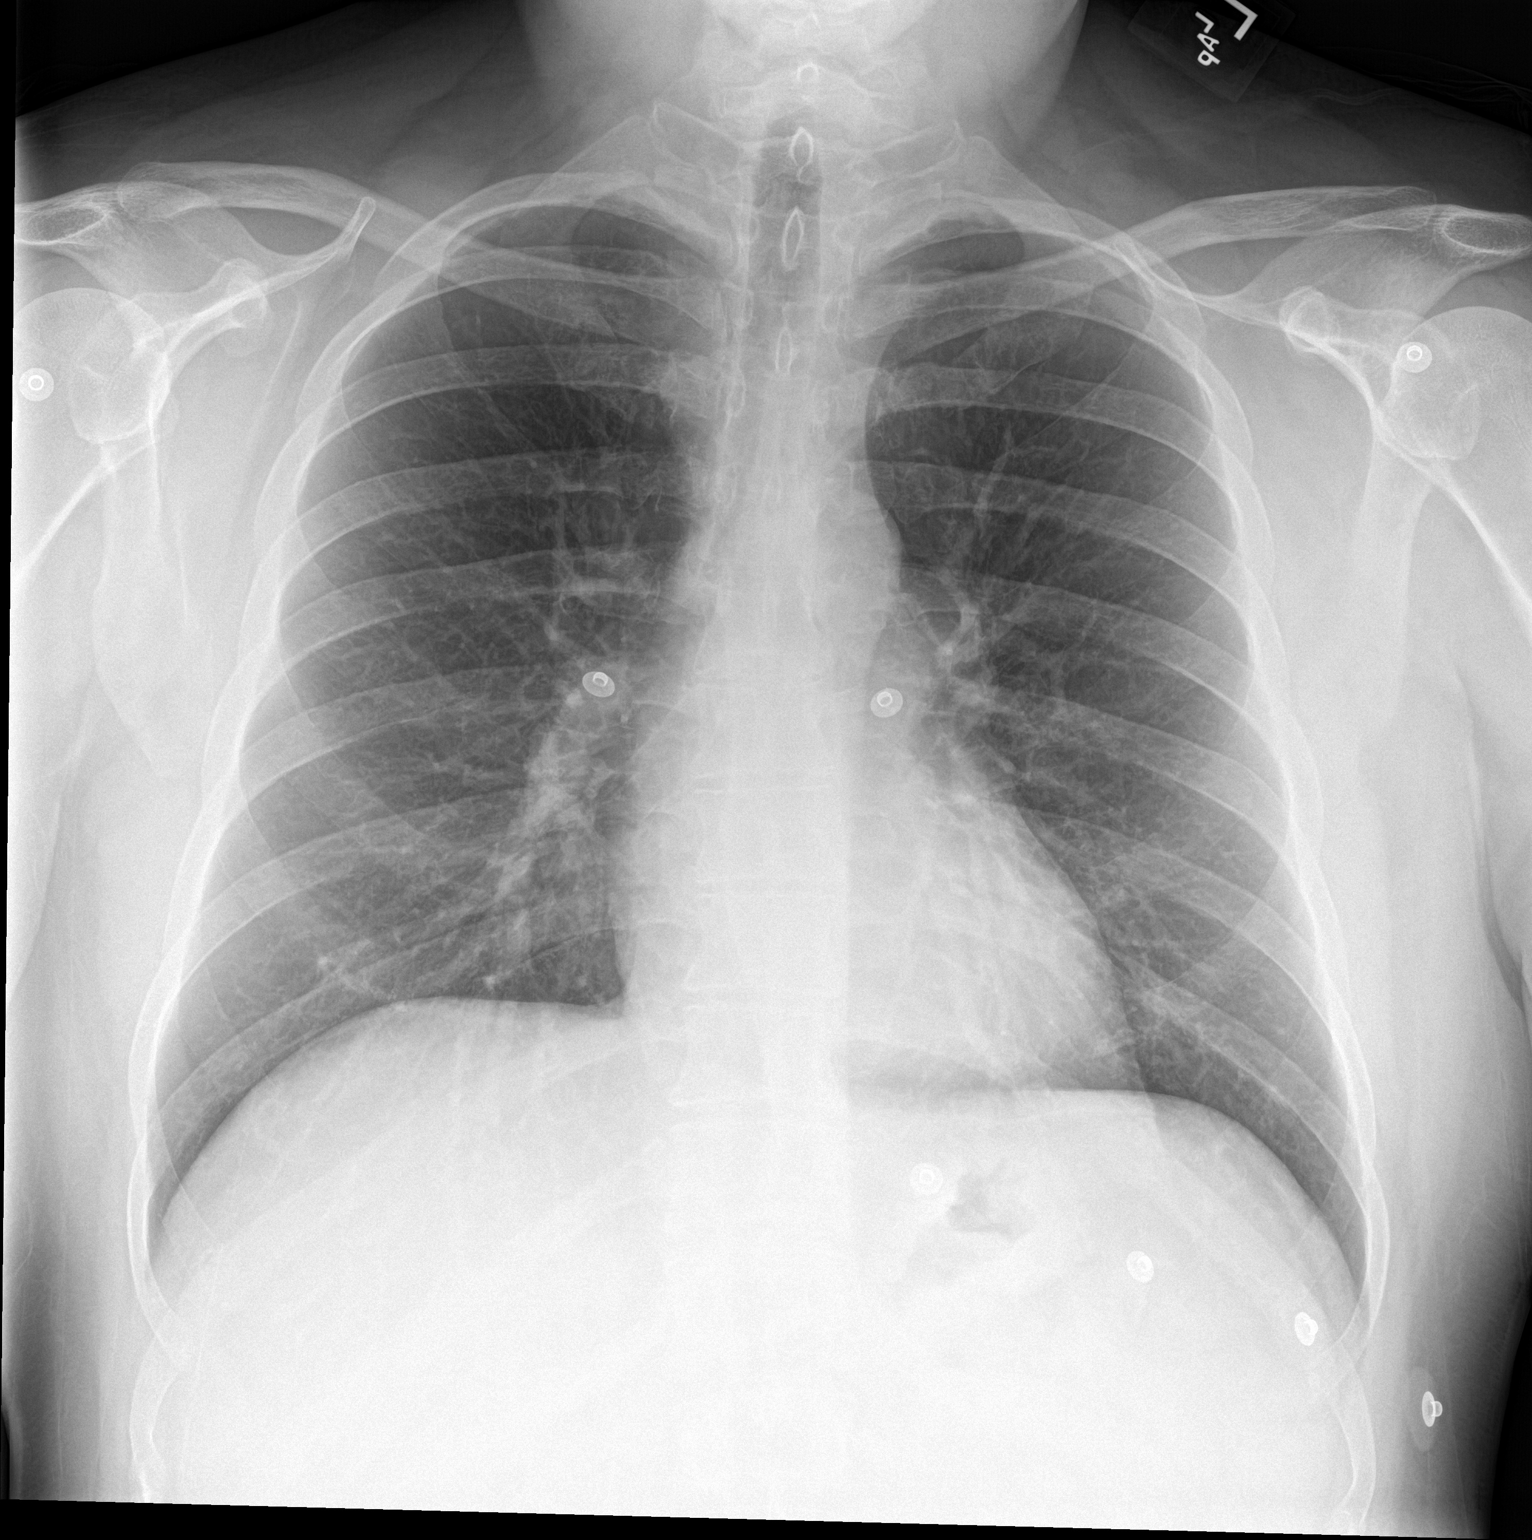

[chest lat]
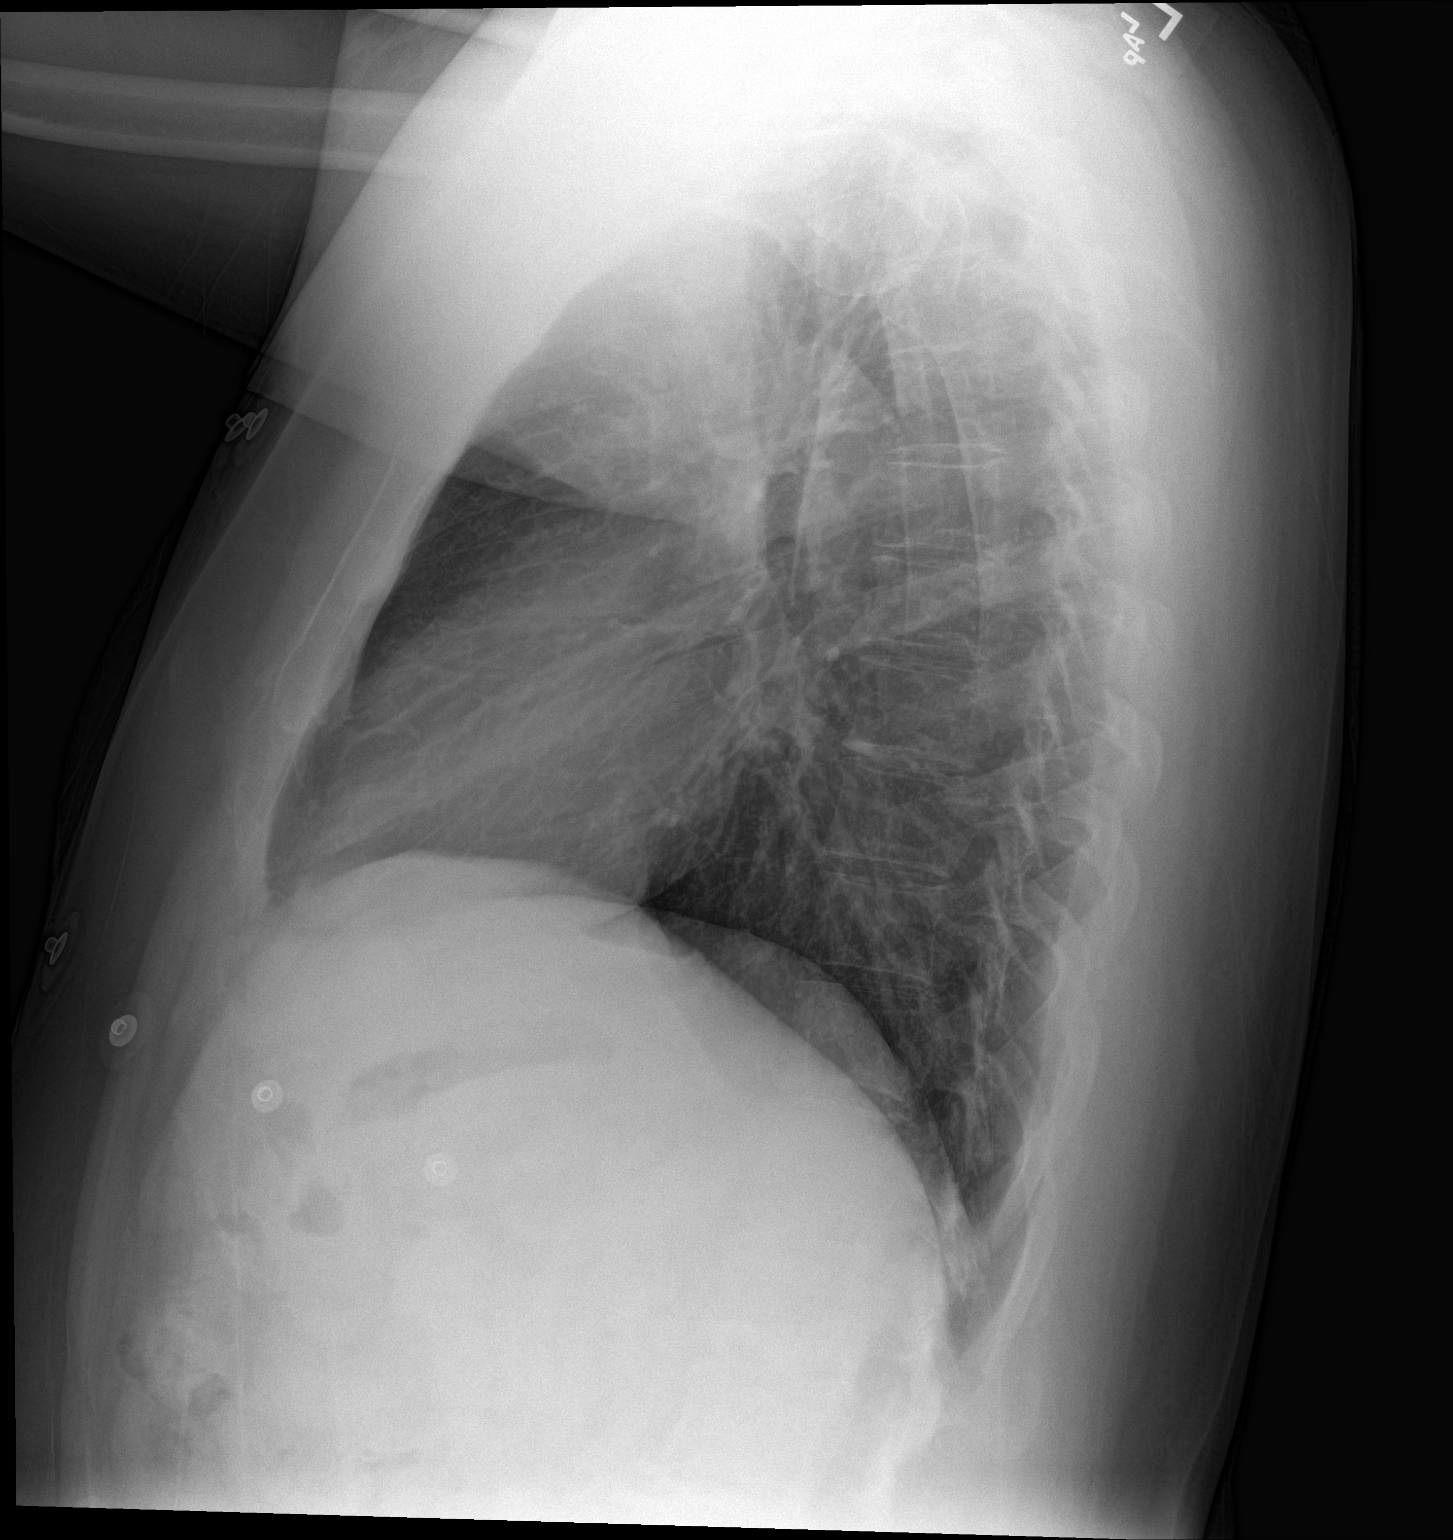

[2 of 2 positions shown; findings below may reference images not displayed]

FINDINGS: The heart size and mediastinal contours are normal. The lungs are
clear. There is no pleural effusion or pneumothorax. No acute
osseous findings are identified.
IMPRESSION: No active cardiopulmonary process.

## 2018-05-18 ENCOUNTER — Other Ambulatory Visit: Payer: Self-pay

## 2018-05-18 ENCOUNTER — Ambulatory Visit
Admission: EM | Admit: 2018-05-18 | Discharge: 2018-05-18 | Disposition: A | Discharge status: Home / Self Care | Payer: BLUE CROSS/BLUE SHIELD | Attending: Family Medicine | Admitting: Family Medicine

## 2018-05-18 DIAGNOSIS — K1121 Acute sialoadenitis: Secondary | ICD-10-CM

## 2018-05-18 MED ORDER — AMOXICILLIN-POT CLAVULANATE 875-125 MG PO TABS: 1.0000 | ORAL_TABLET | Freq: Two times a day (BID) | ORAL | 0 refills | Status: AC

## 2018-05-18 NOTE — ED Triage Notes (Signed)
Patient complains of bilateral facial swelling under his ears that started on Tuesday. Right more than left.

## 2018-05-18 NOTE — ED Provider Notes (Signed)
[facility] ____________________________________________  Time seen: Approximately 12:20 PM  I have reviewed the triage vital signs and the nursing notes.   HISTORY  Chief Complaint Facial Swelling   HPI Hunter Rodriguez is a 43 y.o. male presenting for evaluation of facial swelling in front of his right ear and jaw area present since Tuesday, as well as some swelling to left side since last night.  States he thought he was doing better as the swelling to his right side has been improving over the last 2 days, but states coming in today as he last night noticed some swelling to the left side as well.  Reports overall doing well.  States occasional scratchy throat.  Denies current sore throat.  Denies accompanying cough, congestion, fevers, difficulty swallowing, oral swelling, dental tenderness, shortness of breath, chest pain, rash.  Denies any recent travel.  Denies known sick contacts.  Does work with the public.  Reports immunizations up-to-date throughout adult, including believe he had all his MMR vaccines.  Again denies any oral swelling, throat swelling or difficulty swallowing.  States pain currently mild.  States pain was worse earlier this morning but he did take ibuprofen.  States ibuprofen does help.  Again no accompanying fevers.  Reports otherwise doing well.   Past Medical History:  Diagnosis Date  . Diabetes mellitus without complication ([place])   . Gallstones   . Hypertension     Patient Active Problem List   Diagnosis Date Noted  . Anxiety 07/08/2015  . Elevated liver enzymes 06/24/2015  . Hyperlipidemia 06/24/2015  . Hypertension 06/24/2015  . Fatigue 04/22/2015  . Diabetes mellitus type 2, uncontrolled, without complications ([place]) 69/48/5462  . Obesity 02/25/2015  . Hypertension, well controlled 02/11/2015    History reviewed. No pertinent surgical history.   No current facility-administered medications for this encounter.   Current Outpatient  Medications:  .  ACCU-CHEK AVIVA PLUS test strip, 1 each by Other route 2 (two) times daily. Use as instructed, Disp: 60 each, Rfl: 5 .  ACCU-CHEK SOFTCLIX LANCETS lancets, TEST BLOOD SUGAR ONCE DAILY, Disp: , Rfl: 0 .  aspirin 81 MG tablet, Take 81 mg by mouth daily., Disp: , Rfl:  .  atenolol (TENORMIN) 25 MG tablet, TAKE 1 TABLET(25 MG) BY MOUTH DAILY, Disp: 90 tablet, Rfl: 0 .  Blood Glucose Monitoring Suppl (ACCU-CHEK AVIVA PLUS) w/Device KIT, FPD, Disp: , Rfl: 0 .  clonazePAM (KLONOPIN) 0.5 MG tablet, Take 0.5 tablets (0.25 mg total) by mouth at bedtime as needed for anxiety., Disp: 30 tablet, Rfl: 0 .  lisinopril (PRINIVIL,ZESTRIL) 10 MG tablet, TAKE 1 TABLET(10 MG) BY MOUTH DAILY, Disp: 90 tablet, Rfl: 0 .  metFORMIN (GLUCOPHAGE-XR) 500 MG 24 hr tablet, TK 1 T PO D FOR 1 WK THEN TK 2 TS PO D, Disp: , Rfl:  .  omeprazole (PRILOSEC) 20 MG capsule, Take 1 capsule (20 mg total) by mouth daily., Disp: 90 capsule, Rfl: 0 .  rosuvastatin (CRESTOR) 5 MG tablet, Take 1 tablet (5 mg total) by mouth daily., Disp: 90 tablet, Rfl: 1 .  venlafaxine XR (EFFEXOR-XR) 37.5 MG 24 hr capsule, , Disp: , Rfl:  .  amoxicillin-clavulanate (AUGMENTIN) 875-125 MG tablet, Take 1 tablet by mouth every 12 (twelve) hours., Disp: 20 tablet, Rfl: 0  Allergies Patient has no known allergies.  Family History  Problem Relation Age of Onset  . Hypertension Father   . Heart disease Father        Triple bypass at 35 y.o  Social History Social History   Tobacco Use  . Smoking status: Never Smoker  . Smokeless tobacco: Never Used  Substance Use Topics  . Alcohol use: No    Alcohol/week: 0.0 standard drinks  . Drug use: No    Review of Systems Constitutional: No fever ENT: as above.  Cardiovascular: Denies chest pain. Respiratory: Denies shortness of breath. Gastrointestinal: No abdominal pain.  No nausea, no vomiting.  No diarrhea.  Musculoskeletal: Negative for back pain Skin: Negative for  rash. Neurological: Negative for headaches, focal weakness or numbness.   ____________________________________________   PHYSICAL EXAM:  VITAL SIGNS: ED Triage Vitals  Enc Vitals Group     BP 05/18/18 1118 (!) 137/91     Pulse Rate 05/18/18 1118 81     Resp 05/18/18 1118 18     Temp 05/18/18 1118 99.1 F (37.3 C)     Temp Source 05/18/18 1118 Oral     SpO2 05/18/18 1118 98 %     Weight 05/18/18 1115 225 lb (102.1 kg)     Height 05/18/18 1115 5' 11"  (1.803 m)     Head Circumference --      Peak Flow --      Pain Score 05/18/18 1115 4     Pain Loc --      Pain Edu? --      Excl. in [place]? --     Constitutional: Alert and oriented. Well appearing and in no acute distress. Eyes: Conjunctivae are normal.  Head: Atraumatic. No sinus tenderness to palpation.  No facial bony tenderness.  Right preauricular edema mild to moderate edema located along parotid gland and just inferior to submandibular, no induration, no erythema, mild tenderness to direct palpation.  Left preauricular minimal edema located along left parotid gland, no submandibular edema.  No other facial edema noted.  Ears: no erythema, normal canal, no erythema, normal TMs bilaterally.  No mastoid tenderness bilaterally.  No postauricular edema.  Nose:No nasal congestion  Mouth/Throat: Mucous membranes are moist. No pharyngeal erythema. No tonsillar swelling or exudate.  No oral or oropharyngeal edema noted. Neck: No stridor.  No cervical spine tenderness to palpation. Hematological/Lymphatic/Immunilogical: No cervical lymphadenopathy. Cardiovascular: Normal rate, regular rhythm. Grossly normal heart sounds.  Good peripheral circulation. Respiratory: Normal respiratory effort.  No retractions. No wheezes, rales or rhonchi. Good air movement.  Musculoskeletal: Ambulatory with steady gait. No cervical, thoracic or lumbar tenderness to palpation. Neurologic:  Normal speech and language. No gait instability. Skin:  Skin  appears warm, dry and intact. No rash noted. Psychiatric: Mood and affect are normal. Speech and behavior are normal. ___________________________________________   LABS (all labs ordered are listed, but only abnormal results are displayed)  Labs Reviewed  MISC [facility] TEST (SEND OUT)   ____________________________________________  PROCEDURES Procedures   INITIAL IMPRESSION / ASSESSMENT AND PLAN / ED COURSE  Pertinent labs & imaging results that were available during my care of the patient were reviewed by me and considered in my medical decision making (see chart for details).  Very well-appearing patient.  No acute distress.  Laughing during interaction.  Patient with clinical appearance of parotitis, predominant to the right side with mild inflammation to left.  Patient states that he believes his vaccines are up-to-date.  Will evaluate mumps test.  Discussed multiple differentials including mumps, bacterial versus viral parotitis, salivary stone, and dental infection.  No dental tenderness.  Suspect viral versus bacterial.  As patient doing well without any other accompanying symptoms, will empirically treat with  oral Augmentin.  Continue over-the-counter ibuprofen.  Will test for mumps.  Discussed very strict follow-up and return parameters.  Supportive care.Discussed indication, risks and benefits of medications with patient.  Discussed follow up with Primary care physician this week. Discussed follow up and return parameters including no resolution or any worsening concerns. Patient verbalized understanding and agreed to plan.   ____________________________________________   FINAL CLINICAL IMPRESSION(S) / ED DIAGNOSES  Final diagnoses:  Acute parotitis     ED Discharge Orders         Ordered    amoxicillin-clavulanate (AUGMENTIN) 875-125 MG tablet  Every 12 hours     05/18/18 1232           Note: This dictation was prepared with Dragon dictation along with smaller  phrase technology. Any transcriptional errors that result from this process are unintentional.         Marylene Land, NP 05/18/18 1732

## 2018-05-18 NOTE — Discharge Instructions (Addendum)
Take medication as prescribed. Rest. Drink plenty of fluids.  Continue over-the-counter ibuprofen.  Follow up with your primary care physician this week. Return to Urgent care for new or worsening concerns.

## 2018-05-24 ENCOUNTER — Telehealth (HOSPITAL_COMMUNITY): Payer: Self-pay | Admitting: Family Medicine

## 2018-05-24 LAB — MISC LABCORP TEST (SEND OUT): Labcorp test code: 186150

## 2018-05-24 NOTE — Telephone Encounter (Signed)
Called and discussed positive mumps diagnosis. Contagion.  Recovery.  Complications.  All ;questions answered

## 2022-06-28 ENCOUNTER — Other Ambulatory Visit: Payer: Self-pay

## 2022-06-28 DIAGNOSIS — E785 Hyperlipidemia, unspecified: Secondary | ICD-10-CM

## 2022-06-30 ENCOUNTER — Ambulatory Visit
Admission: RE | Admit: 2022-06-30 | Discharge: 2022-06-30 | Disposition: A | Discharge status: Home / Self Care | Payer: Self-pay | Source: Ambulatory Visit | Attending: Family Medicine | Admitting: Family Medicine

## 2022-06-30 DIAGNOSIS — E785 Hyperlipidemia, unspecified: Secondary | ICD-10-CM | POA: Insufficient documentation

## 2023-04-16 ENCOUNTER — Other Ambulatory Visit: Payer: Self-pay

## 2023-04-16 ENCOUNTER — Emergency Department
Admission: EM | Admit: 2023-04-16 | Discharge: 2023-04-16 | Disposition: A | Discharge status: Home / Self Care | Payer: BC Managed Care – PPO | Attending: Emergency Medicine | Admitting: Emergency Medicine

## 2023-04-16 DIAGNOSIS — A084 Viral intestinal infection, unspecified: Secondary | ICD-10-CM | POA: Insufficient documentation

## 2023-04-16 DIAGNOSIS — R109 Unspecified abdominal pain: Secondary | ICD-10-CM | POA: Diagnosis present

## 2023-04-16 LAB — COMPREHENSIVE METABOLIC PANEL
ALT: 55 U/L — ABNORMAL HIGH (ref 0–44)
AST: 52 U/L — ABNORMAL HIGH (ref 15–41)
Albumin: 4 g/dL (ref 3.5–5.0)
Alkaline Phosphatase: 64 U/L (ref 38–126)
Anion gap: 13 (ref 5–15)
BUN: 21 mg/dL — ABNORMAL HIGH (ref 6–20)
CO2: 24 mmol/L (ref 22–32)
Calcium: 8.8 mg/dL — ABNORMAL LOW (ref 8.9–10.3)
Chloride: 96 mmol/L — ABNORMAL LOW (ref 98–111)
Creatinine, Ser: 0.88 mg/dL (ref 0.61–1.24)
GFR, Estimated: >60 mL/min (ref >60)
Glucose, Bld: 206 mg/dL — ABNORMAL HIGH (ref 70–99)
Potassium: 3.3 mmol/L — ABNORMAL LOW (ref 3.5–5.1)
Sodium: 133 mmol/L — ABNORMAL LOW (ref 135–145)
Total Bilirubin: 0.8 mg/dL (ref 0.0–1.2)
Total Protein: 8 g/dL (ref 6.5–8.1)

## 2023-04-16 LAB — CBC
HCT: 44.3 % (ref 39.0–52.0)
Hemoglobin: 16 g/dL (ref 13.0–17.0)
MCH: 33 pg (ref 26.0–34.0)
MCHC: 36.1 g/dL — ABNORMAL HIGH (ref 30.0–36.0)
MCV: 91.3 fL (ref 80.0–100.0)
Platelets: 179 10*3/uL (ref 150–400)
RBC: 4.85 MIL/uL (ref 4.22–5.81)
RDW: 11.7 % (ref 11.5–15.5)
WBC: 3.5 10*3/uL — ABNORMAL LOW (ref 4.0–10.5)
nRBC: 0 % (ref 0.0–0.2)

## 2023-04-16 LAB — URINALYSIS, ROUTINE W REFLEX MICROSCOPIC
Bacteria, UA: NONE SEEN
Bilirubin Urine: NEGATIVE
Glucose, UA: 50 mg/dL — AB
Hgb urine dipstick: NEGATIVE
Ketones, ur: 5 mg/dL — AB
Leukocytes,Ua: NEGATIVE
Nitrite: NEGATIVE
Protein, ur: 100 mg/dL — AB
Specific Gravity, Urine: 1.03 (ref 1.005–1.030)
pH: 5 (ref 5.0–8.0)

## 2023-04-16 LAB — LIPASE, BLOOD: Lipase: 33 U/L (ref 11–51)

## 2023-04-16 MED ORDER — DICYCLOMINE HCL 10 MG PO CAPS: 10.0000 mg | ORAL_CAPSULE | Freq: Two times a day (BID) | ORAL | 0 refills | Status: AC | PRN

## 2023-04-16 MED ORDER — KETOROLAC TROMETHAMINE 15 MG/ML IJ SOLN
15.0000 mg | Freq: Once | INTRAMUSCULAR | Status: AC
  Administered 2023-04-16: 15 mg via INTRAVENOUS
  Filled 2023-04-16: qty 1

## 2023-04-16 MED ORDER — DICYCLOMINE HCL 10 MG PO CAPS
10.0000 mg | ORAL_CAPSULE | Freq: Once | ORAL | Status: AC
  Administered 2023-04-16: 10 mg via ORAL
  Filled 2023-04-16: qty 1

## 2023-04-16 MED ORDER — ONDANSETRON HCL 4 MG/2ML IJ SOLN
4.0000 mg | Freq: Once | INTRAMUSCULAR | Status: AC
  Administered 2023-04-16: 4 mg via INTRAVENOUS
  Filled 2023-04-16: qty 2

## 2023-04-16 MED ORDER — LACTATED RINGERS IV BOLUS
1000.0000 mL | Freq: Once | INTRAVENOUS | Status: AC
  Administered 2023-04-16: 1000 mL via INTRAVENOUS

## 2023-04-16 NOTE — ED Triage Notes (Signed)
 Pt comes with c/o lower belly pain, diarrhea. Pt states he  thinks he had noro virus and was getting better he thought. Pt states he is still hurting, having leg cramps and unable to keep anything down.

## 2023-04-16 NOTE — ED Provider Notes (Signed)
 [facility] Provider Note    Event Date/Time   First MD Initiated Contact with Patient 04/16/23 1121     (approximate)   History   Abdominal Pain   HPI Hunter Rodriguez is a 48 y.o. male presenting today for nausea, diarrhea, and abdominal cramping.  He states that he had nausea, vomiting, and diarrhea symptoms for the past 5 to 7 days.  Over the past couple days he has had some abdominal cramping present as well.  Then vomiting has largely resolved at this point and he thinks the cramping is from lack of intake.  Decreased urinary output as well over the past several days.  Otherwise denies fever, sharp abdominal pain, chest pain, shortness of breath.     Physical Exam   Triage Vital Signs: ED Triage Vitals  Encounter Vitals Group     BP 04/16/23 0929 (!) 160/142     Systolic BP Percentile --      Diastolic BP Percentile --      Pulse Rate 04/16/23 0929 (!) 108     Resp 04/16/23 0929 18     Temp 04/16/23 0929 98 F (36.7 C)     Temp src --      SpO2 04/16/23 0929 98 %     Weight 04/16/23 0931 205 lb (93 kg)     Height 04/16/23 0931 5' 9 (1.753 m)     Head Circumference --      Peak Flow --      Pain Score 04/16/23 0929 5     Pain Loc --      Pain Education --      Exclude from Growth Chart --     Most recent vital signs: Vitals:   04/16/23 0929 04/16/23 1338  BP: (!) 160/142 (!) 154/94  Pulse: (!) 108 92  Resp: 18 18  Temp: 98 F (36.7 C) 98.8 F (37.1 C)  SpO2: 98% 98%   Physical Exam: I have reviewed the vital signs and nursing notes. General: Awake, alert, no acute distress.  Nontoxic appearing. Head:  Atraumatic, normocephalic.   ENT:  EOM intact, PERRL. Oral mucosa is pink and moist with no lesions. Neck: Neck is supple with full range of motion, No meningeal signs. Cardiovascular:  RRR, No murmurs. Peripheral pulses palpable and equal bilaterally. Respiratory:  Symmetrical chest wall expansion.  No rhonchi, rales, or wheezes.   Good air movement throughout.  No use of accessory muscles.   Musculoskeletal:  No cyanosis or edema. Moving extremities with full ROM Abdomen:  Soft, nontender, nondistended. Neuro:  GCS 15, moving all four extremities, interacting appropriately. Speech clear. Psych:  Calm, appropriate.   Skin:  Warm, dry, no rash.    ED Results / Procedures / Treatments   Labs (all labs ordered are listed, but only abnormal results are displayed) Labs Reviewed  COMPREHENSIVE METABOLIC PANEL - Abnormal; Notable for the following components:      Result Value   Sodium 133 (*)    Potassium 3.3 (*)    Chloride 96 (*)    Glucose, Bld 206 (*)    BUN 21 (*)    Calcium  8.8 (*)    AST 52 (*)    ALT 55 (*)    All other components within normal limits  CBC - Abnormal; Notable for the following components:   WBC 3.5 (*)    MCHC 36.1 (*)    All other components within normal limits  URINALYSIS, ROUTINE W REFLEX  MICROSCOPIC - Abnormal; Notable for the following components:   Color, Urine YELLOW (*)    APPearance CLEAR (*)    Glucose, UA 50 (*)    Ketones, ur 5 (*)    Protein, ur 100 (*)    All other components within normal limits  LIPASE, BLOOD     EKG    RADIOLOGY    PROCEDURES:  Critical Care performed: No  Procedures   MEDICATIONS ORDERED IN ED: Medications  ondansetron  (ZOFRAN ) injection 4 mg (4 mg Intravenous Given 04/16/23 1303)  ketorolac  (TORADOL ) 15 MG/ML injection 15 mg (15 mg Intravenous Given 04/16/23 1303)  lactated ringers  bolus 1,000 mL (1,000 mLs Intravenous New Bag/Given 04/16/23 1305)  dicyclomine  (BENTYL ) capsule 10 mg (10 mg Oral Given 04/16/23 1308)     IMPRESSION / MDM / ASSESSMENT AND PLAN / ED COURSE  I reviewed the triage vital signs and the nursing notes.                              Differential diagnosis includes, but is not limited to, viral gastroenteritis, cramping secondary to diarrhea, dehydration, electrolyte abnormality  Patient's presentation is  most consistent with acute complicated illness / injury requiring diagnostic workup.  Patient is a 48 year old male presenting today for nausea, vomiting, diarrhea, and abdominal cramping.  No focality to his abdominal pain.  Appears most likely to be viral gastroenteritis related.  Slight tachycardia on exam and ketones in his urine suggestive of dehydration.  Will give 1 L of fluids, Toradol , Zofran , and Bentyl .  Given mild abdominal exam, no indication for CT at this time.  Laboratory workup otherwise largely reassuring.  Leukopenia may be indicative of recent viral infection.  Patient reassessed following medication and feeling better.  Tolerated p.o. without issue.  Will discharge with as needed Bentyl  to help with bowel spasms.  Given strict return precautions and follow-up with PCP.     FINAL CLINICAL IMPRESSION(S) / ED DIAGNOSES   Final diagnoses:  Viral gastroenteritis     Rx / DC Orders   ED Discharge Orders          Ordered    dicyclomine  (BENTYL ) 10 MG capsule  2 times daily PRN        04/16/23 1431             Note:  This document was prepared using Dragon voice recognition software and may include unintentional dictation errors.   Malvina Alm DASEN, MD 04/16/23 [phone]

## 2023-04-16 NOTE — Discharge Instructions (Addendum)
 Please follow-up with your primary care provider as needed for ongoing management.  Please return if the abdominal pain starts becoming sharp and more severe associated with fevers and recurrent vomiting.  You can continue taking ibuprofen and Tylenol with food and drink.
